# Patient Record
Sex: Female | Born: 1970 | Race: Black or African American | Hispanic: No | Marital: Single | Smoking: Never smoker
Health system: Southern US, Community
[De-identification: ages and names within clinical notes are randomized; demographics above are authoritative.]

## PROBLEM LIST (undated history)

## (undated) DIAGNOSIS — D649 Anemia, unspecified: Secondary | ICD-10-CM

## (undated) DIAGNOSIS — E119 Type 2 diabetes mellitus without complications: Secondary | ICD-10-CM

## (undated) DIAGNOSIS — E538 Deficiency of other specified B group vitamins: Secondary | ICD-10-CM

## (undated) DIAGNOSIS — I10 Essential (primary) hypertension: Secondary | ICD-10-CM

## (undated) DIAGNOSIS — E785 Hyperlipidemia, unspecified: Secondary | ICD-10-CM

## (undated) HISTORY — DX: Anemia, unspecified: D64.9

## (undated) HISTORY — DX: Hyperlipidemia, unspecified: E78.5

## (undated) HISTORY — PX: PARTIAL HYSTERECTOMY: SHX80

## (undated) HISTORY — DX: Type 2 diabetes mellitus without complications: E11.9

## (undated) HISTORY — PX: ABDOMINOPLASTY: SUR9

## (undated) HISTORY — PX: ABDOMINAL HYSTERECTOMY: SHX81

## (undated) HISTORY — DX: Essential (primary) hypertension: I10

## (undated) HISTORY — DX: Deficiency of other specified B group vitamins: E53.8

---

## 1998-01-26 HISTORY — PX: TUBAL LIGATION: SHX77

## 2015-07-01 DIAGNOSIS — E782 Mixed hyperlipidemia: Secondary | ICD-10-CM | POA: Insufficient documentation

## 2015-11-18 DIAGNOSIS — I10 Essential (primary) hypertension: Secondary | ICD-10-CM | POA: Insufficient documentation

## 2016-04-06 DIAGNOSIS — G44229 Chronic tension-type headache, not intractable: Secondary | ICD-10-CM | POA: Insufficient documentation

## 2017-02-09 DIAGNOSIS — D509 Iron deficiency anemia, unspecified: Secondary | ICD-10-CM | POA: Insufficient documentation

## 2017-12-09 DIAGNOSIS — R400 Somnolence: Secondary | ICD-10-CM | POA: Insufficient documentation

## 2018-03-09 DIAGNOSIS — E1165 Type 2 diabetes mellitus with hyperglycemia: Secondary | ICD-10-CM | POA: Insufficient documentation

## 2018-03-09 DIAGNOSIS — E669 Obesity, unspecified: Secondary | ICD-10-CM | POA: Insufficient documentation

## 2019-07-11 DIAGNOSIS — E11319 Type 2 diabetes mellitus with unspecified diabetic retinopathy without macular edema: Secondary | ICD-10-CM | POA: Insufficient documentation

## 2019-09-11 DIAGNOSIS — Z6825 Body mass index (BMI) 25.0-25.9, adult: Secondary | ICD-10-CM | POA: Insufficient documentation

## 2020-03-14 DIAGNOSIS — E538 Deficiency of other specified B group vitamins: Secondary | ICD-10-CM | POA: Insufficient documentation

## 2020-12-09 ENCOUNTER — Encounter: Payer: Self-pay | Admitting: Family Medicine

## 2020-12-09 ENCOUNTER — Ambulatory Visit: Payer: BC Managed Care – PPO | Admitting: Family Medicine

## 2020-12-09 ENCOUNTER — Other Ambulatory Visit: Payer: Self-pay

## 2020-12-09 VITALS — BP 98/72 | HR 89 | Temp 98.2°F | Ht 69.0 in | Wt 191.0 lb

## 2020-12-09 DIAGNOSIS — N898 Other specified noninflammatory disorders of vagina: Secondary | ICD-10-CM | POA: Diagnosis not present

## 2020-12-09 DIAGNOSIS — B9689 Other specified bacterial agents as the cause of diseases classified elsewhere: Secondary | ICD-10-CM | POA: Diagnosis not present

## 2020-12-09 DIAGNOSIS — E118 Type 2 diabetes mellitus with unspecified complications: Secondary | ICD-10-CM | POA: Diagnosis not present

## 2020-12-09 DIAGNOSIS — N760 Acute vaginitis: Secondary | ICD-10-CM | POA: Diagnosis not present

## 2020-12-09 LAB — POCT WET PREP (WET MOUNT)
Trichomonas Wet Prep HPF POC: ABSENT
WBC, Wet Prep HPF POC: ABSENT

## 2020-12-09 LAB — POCT URINALYSIS DIPSTICK
Bilirubin, UA: NEGATIVE
Glucose, UA: POSITIVE — AB
Ketones, UA: NEGATIVE
Leukocytes, UA: NEGATIVE
Nitrite, UA: NEGATIVE
Protein, UA: NEGATIVE
Spec Grav, UA: 1.015 (ref 1.010–1.025)
Urobilinogen, UA: 0.2 E.U./dL
pH, UA: 5 (ref 5.0–8.0)

## 2020-12-09 MED ORDER — METRONIDAZOLE 500 MG PO TABS: 500.0000 mg | ORAL_TABLET | Freq: Two times a day (BID) | ORAL | 0 refills | Status: AC

## 2020-12-09 MED ORDER — JANUVIA 100 MG PO TABS: 100.0000 mg | ORAL_TABLET | Freq: Every day | ORAL | 0 refills | Status: DC

## 2020-12-09 NOTE — Assessment & Plan Note (Signed)
Patient with a few day history of vaginal discharge, thin in nature, mild dysuria denies fever, chills, flank pain.  Abdominal exam reveals soft, nontender, mildly distended and tympanic abdomen with normoactive bowel sounds, no CVA tenderness, suprapubic tenderness absent.  Urinalysis significant for blood and glucose, this will be sent for culture, wet prep performed revealing clue cells.  Plan for metronidazole twice daily x7 days, supportive care, follow-up as needed.

## 2020-12-09 NOTE — Progress Notes (Signed)
Primary Care / Sports Medicine Office Visit  Patient Information:  Patient ID: Kirsten Clark, female DOB: 1970/09/14 Age: 50 y.o. MRN: [id number]   Kirsten Clark is a pleasant 50 y.o. 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   atorvastatin (LIPITOR) 40 MG tablet Take 40 mg by mouth daily.   cyanocobalamin (,VITAMIN B-12,) 1000 MCG/ML injection Inject 1 mL into the muscle every 30 (thirty) days.   glipiZIDE (GLUCOTROL XL) 10 MG 24 hr tablet Take 10 mg by mouth daily.   JARDIANCE 25 MG TABS tablet Take 25 mg by mouth daily.   lisinopril-hydrochlorothiazide (ZESTORETIC) 10-12.5 MG tablet Take 1 tablet by mouth daily.   metFORMIN (GLUCOPHAGE) 1000 MG tablet Take 1,000 mg by mouth 2 (two) times daily.   metroNIDAZOLE (FLAGYL) 500 MG tablet Take 1 tablet (500 mg total) by mouth 2 (two) times daily for 7 days.   valACYclovir (VALTREX) 500 MG tablet Take 500 mg by mouth as needed.   [DISCONTINUED] JANUVIA 100 MG tablet Take 100 mg by mouth daily.   JANUVIA 100 MG tablet Take 1 tablet (100 mg total) by mouth daily.   [DISCONTINUED]  lisinopril (ZESTRIL) 40 MG tablet Take 40 mg by mouth daily.   [DISCONTINUED] terconazole (TERAZOL 7) 0.4 % vaginal cream Place 1 applicator vaginally as needed.   No facility-administered encounter medications on file as of 12/09/2020.   Past Surgical History:  Procedure Laterality Date   ABDOMINAL HYSTERECTOMY     ABDOMINOPLASTY     TUBAL LIGATION  2000    Vitals:   12/09/20 1538  BP: 98/72  Pulse: 89  Temp: 98.2 F (36.8 C)  SpO2: 98%   Vitals:   12/09/20 1538  Weight: 191 lb (86.6 kg)  Height: 5\' 9"  (1.753 m)   Body mass index is 28.21 kg/m.  No results found.   Independent interpretation of notes and tests performed by another provider:   None  Procedures performed:   None  Pertinent History, Exam, Impression, and Recommendations:   Bacterial vaginosis Patient with a few day history of vaginal discharge, thin in nature, mild dysuria denies fever, chills, flank pain.  Abdominal exam reveals soft, nontender, mildly distended and tympanic abdomen with normoactive bowel sounds, no CVA tenderness, suprapubic tenderness absent.  Urinalysis significant for blood and glucose, this will be sent for culture, wet prep performed revealing clue cells.  Plan for metronidazole twice daily x7 days, supportive care, follow-up as needed.  Vaginal discharge See additional assessment(s) for plan details.   Orders & Medications Meds ordered this encounter  Medications   metroNIDAZOLE (FLAGYL) 500 MG tablet  Sig: Take 1 tablet (500 mg total) by mouth 2 (two) times daily for 7 days.    Dispense:  14 tablet    Refill:  0   JANUVIA 100 MG tablet    Sig: Take 1 tablet (100 mg total) by mouth daily.    Dispense:  90 tablet    Refill:  0   Orders Placed This Encounter  Procedures   Urine Culture   POCT Urinalysis Dipstick   POCT Wet Prep [facility])     Return in about 3 weeks (around 12/30/2020) for 3-4 weeks annual physical.     Jerrol Banana, MD   Primary Care  Sports Medicine [facility]

## 2020-12-09 NOTE — Assessment & Plan Note (Signed)
See additional assessment(s) for plan details. 

## 2020-12-09 NOTE — Patient Instructions (Addendum)
-   Start metronidazole, dose twice daily x7 days - Continue your current medications as prescribed, contact us for any refill requests - Maintain follow-up with gynecology as scheduled - Return for follow-up in 3-4 weeks for annual physical, contact us for any questions between now and then

## 2020-12-13 LAB — URINE CULTURE

## 2021-01-06 ENCOUNTER — Other Ambulatory Visit: Payer: Self-pay

## 2021-01-06 DIAGNOSIS — Z1211 Encounter for screening for malignant neoplasm of colon: Secondary | ICD-10-CM

## 2021-01-06 MED ORDER — CLENPIQ 10-3.5-12 MG-GM -GM/160ML PO SOLN
1.0000 | Freq: Once | ORAL | 0 refills | Status: AC
Start: 2021-01-06 — End: 2021-01-06

## 2021-01-06 NOTE — Progress Notes (Signed)
Gastroenterology Pre-Procedure Review  Request Date: 01/23/21 Requesting Physician: Dr. Servando Snare  PATIENT REVIEW QUESTIONS: The patient responded to the following health history questions as indicated:    1. Are you having any GI issues? no 2. Do you have a personal history of Polyps? no 3. Do you have a family history of Colon Cancer or Polyps? no 4. Diabetes Mellitus? yes (Type II) 5. Joint replacements in the past 12 months?no 6. Major health problems in the past 3 months?no 7. Any artificial heart valves, MVP, or defibrillator?no    MEDICATIONS & ALLERGIES:    Patient reports the following regarding taking any anticoagulation/antiplatelet therapy:   Plavix, Coumadin, Eliquis, Xarelto, Lovenox, Pradaxa, Brilinta, or Effient? no Aspirin? no  Patient confirms/reports the following medications:  Current Outpatient Medications  Medication Sig Dispense Refill   acyclovir cream (ZOVIRAX) 5 % Apply 1 application topically as needed.     atorvastatin (LIPITOR) 40 MG tablet Take 40 mg by mouth daily.     cyanocobalamin (,VITAMIN B-12,) 1000 MCG/ML injection Inject 1 mL into the muscle every 30 (thirty) days.     glipiZIDE (GLUCOTROL XL) 10 MG 24 hr tablet Take 10 mg by mouth daily.     JANUVIA 100 MG tablet Take 1 tablet (100 mg total) by mouth daily. 90 tablet 0   JARDIANCE 25 MG TABS tablet Take 25 mg by mouth daily.     lisinopril-hydrochlorothiazide (ZESTORETIC) 10-12.5 MG tablet Take 1 tablet by mouth daily.     metFORMIN (GLUCOPHAGE) 1000 MG tablet Take 1,000 mg by mouth 2 (two) times daily.     valACYclovir (VALTREX) 500 MG tablet Take 500 mg by mouth as needed.     No current facility-administered medications for this visit.    Patient confirms/reports the following allergies:  No Known Allergies  No orders of the defined types were placed in this encounter.   AUTHORIZATION INFORMATION Primary Insurance: 1D#: Group #:  Secondary Insurance: 1D#: Group #:  SCHEDULE  INFORMATION: Date: 01/23/21 Time: Location: [facility]

## 2021-01-07 ENCOUNTER — Ambulatory Visit (INDEPENDENT_AMBULATORY_CARE_PROVIDER_SITE_OTHER): Payer: BC Managed Care – PPO | Admitting: Family Medicine

## 2021-01-07 ENCOUNTER — Encounter: Payer: Self-pay | Admitting: Family Medicine

## 2021-01-07 ENCOUNTER — Other Ambulatory Visit: Payer: Self-pay

## 2021-01-07 VITALS — BP 118/84 | HR 83 | Ht 69.0 in | Wt 196.0 lb

## 2021-01-07 DIAGNOSIS — Z0000 Encounter for general adult medical examination without abnormal findings: Secondary | ICD-10-CM | POA: Insufficient documentation

## 2021-01-07 DIAGNOSIS — I10 Essential (primary) hypertension: Secondary | ICD-10-CM

## 2021-01-07 DIAGNOSIS — E1165 Type 2 diabetes mellitus with hyperglycemia: Secondary | ICD-10-CM

## 2021-01-07 DIAGNOSIS — Z114 Encounter for screening for human immunodeficiency virus [HIV]: Secondary | ICD-10-CM | POA: Diagnosis not present

## 2021-01-07 DIAGNOSIS — E782 Mixed hyperlipidemia: Secondary | ICD-10-CM

## 2021-01-07 DIAGNOSIS — E538 Deficiency of other specified B group vitamins: Secondary | ICD-10-CM

## 2021-01-07 DIAGNOSIS — Z1159 Encounter for screening for other viral diseases: Secondary | ICD-10-CM

## 2021-01-07 DIAGNOSIS — D509 Iron deficiency anemia, unspecified: Secondary | ICD-10-CM

## 2021-01-07 DIAGNOSIS — E559 Vitamin D deficiency, unspecified: Secondary | ICD-10-CM | POA: Diagnosis not present

## 2021-01-07 DIAGNOSIS — E118 Type 2 diabetes mellitus with unspecified complications: Secondary | ICD-10-CM | POA: Insufficient documentation

## 2021-01-07 DIAGNOSIS — Z13220 Encounter for screening for lipoid disorders: Secondary | ICD-10-CM

## 2021-01-07 LAB — POCT URINALYSIS DIPSTICK
Bilirubin, UA: NEGATIVE
Blood, UA: NEGATIVE
Glucose, UA: POSITIVE — AB
Ketones, UA: NEGATIVE
Leukocytes, UA: NEGATIVE
Nitrite, UA: 0.2
Protein, UA: NEGATIVE
Spec Grav, UA: 1.01 (ref 1.010–1.025)
Urobilinogen, UA: NEGATIVE E.U./dL — AB
pH, UA: 7 (ref 5.0–8.0)

## 2021-01-07 NOTE — Patient Instructions (Signed)
-   Obtain fasting labs with orders provided (can have water or black coffee but otherwise no food or drink x 8 hours before labs) - Review information provided - Attend eye doctor annually, dentist every 6 months, work towards or maintain 30 minutes of moderate intensity physical activity at least 5 days per week, and consume a balanced diet - Return in 3 months - Contact us for any questions between now and then 

## 2021-01-07 NOTE — Assessment & Plan Note (Signed)
Risk stratification labs ordered, compliant with medications, referral to nutrition offered, patient declined; follow-up in 3 months.

## 2021-01-07 NOTE — Assessment & Plan Note (Signed)
Noted on history, repeat iron panel ordered in addition to CBC.  We will follow results and treat accordingly.

## 2021-01-07 NOTE — Assessment & Plan Note (Signed)
See additional assessment(s) for plan details. Medical record release for optometry records sought.

## 2021-01-07 NOTE — Assessment & Plan Note (Signed)
Annual examination completed, risk stratification labs ordered, anticipatory guidance provided.  We will follow labs once resulted. 

## 2021-01-07 NOTE — Assessment & Plan Note (Signed)
Risk stratification labs ordered 

## 2021-01-07 NOTE — Assessment & Plan Note (Signed)
Noted on history, recheck of B12, folate, CBC ordered

## 2021-01-07 NOTE — Assessment & Plan Note (Signed)
Stable, controlled.  We will follow-up in 3 months.

## 2021-01-07 NOTE — Progress Notes (Signed)
Annual Physical Exam Visit  Patient Information:  Patient ID: Kirsten Clark, female DOB: 1970-06-22 Age: 50 y.o. MRN: [id number]   Subjective:   CC: Annual Physical Exam  HPI:  Kirsten Clark is here for their annual physical.  I reviewed the past medical history, family history, social history, surgical history, and allergies today and changes were made as necessary.  Please see the problem list section below for additional details.  Past Medical History: Past Medical History:  Diagnosis Date   Anemia    Diabetes mellitus without complication (HCC)    Hyperlipidemia    Hypertension    Vitamin B12 deficiency    Past Surgical History: Past Surgical History:  Procedure Laterality Date   ABDOMINAL HYSTERECTOMY     ABDOMINOPLASTY     TUBAL LIGATION  2000   Family History: Family History  Problem Relation Age of Onset   Diabetes Mother    Diabetes Paternal Grandfather    Allergies: No Known Allergies Health Maintenance: Health Maintenance  Topic Date Due   HEMOGLOBIN A1C  Never done   Pneumococcal Vaccine 34-24 Years old (1 - PCV) Never done   Hepatitis C Screening  Never done   Zoster Vaccines- Shingrix (1 of 2) Never done   COLONOSCOPY (Pts 45-63yrs Insurance coverage will need to be confirmed)  01/24/2021 (Originally 10/15/2015)   COVID-19 Vaccine (3 - Booster for Moderna series) 02/25/2021 (Originally 08/10/2019)   INFLUENZA VACCINE  04/25/2021 (Originally 08/26/2020)   OPHTHALMOLOGY EXAM  11/26/2021   FOOT EXAM  01/07/2022   MAMMOGRAM  12/24/2022   PAP SMEAR-Modifier  12/24/2023   TETANUS/TDAP  01/27/2028   HIV Screening  Completed   HPV VACCINES  Aged Out    HM Colonoscopy           Postponed - COLONOSCOPY (Pts 45-72yrs Insurance coverage will need to be confirmed) (Every 10 Years) Postponed until 01/24/2021    No completion history exists for this topic.           Medications: Current Outpatient Medications on File Prior to Visit  Medication  Sig Dispense Refill   acyclovir cream (ZOVIRAX) 5 % Apply 1 application topically every 6 (six) hours as needed.     atorvastatin (LIPITOR) 40 MG tablet Take 40 mg by mouth daily.     glipiZIDE (GLUCOTROL XL) 10 MG 24 hr tablet Take 10 mg by mouth daily.     JANUVIA 100 MG tablet Take 1 tablet (100 mg total) by mouth daily. 90 tablet 0   JARDIANCE 25 MG TABS tablet Take 25 mg by mouth daily.     lisinopril-hydrochlorothiazide (ZESTORETIC) 10-12.5 MG tablet Take 1 tablet by mouth daily.     metFORMIN (GLUCOPHAGE) 1000 MG tablet Take 1,000 mg by mouth 2 (two) times daily.     valACYclovir (VALTREX) 500 MG tablet Take 500 mg by mouth as needed.     CLENPIQ 10-3.5-12 MG-GM -GM/160ML SOLN Take by mouth. (Patient not taking: Reported on 01/07/2021)     cyanocobalamin (,VITAMIN B-12,) 1000 MCG/ML injection Inject 1 mL into the muscle every 30 (thirty) days. (Patient not taking: Reported on 01/07/2021)     No current facility-administered medications on file prior to visit.    Review of Systems: No headache, visual changes, nausea, vomiting, diarrhea, constipation, dizziness, abdominal pain, skin rash, fevers, chills, night sweats, swollen lymph nodes, weight loss, chest pain, body aches, joint swelling, muscle aches, shortness of breath, mood changes, visual or auditory hallucinations reported.  Objective:  Vitals:   01/07/21 1341  BP: 118/84  Pulse: 83  SpO2: 96%   Vitals:   01/07/21 1341  Weight: 196 lb (88.9 kg)  Height: 5\' 9"  (1.753 m)   Body mass index is 28.94 kg/m.  General: Well Developed, well nourished, and in no acute distress.  Neuro: Alert and oriented x3, extra-ocular muscles intact, sensation grossly intact. Cranial nerves II through XII are grossly intact, motor, sensory, and coordinative functions are intact. HEENT: Normocephalic, atraumatic, pupils equal round reactive to light, neck supple, no masses, no lymphadenopathy, thyroid nonpalpable. Oropharynx, nasopharynx,  external ear canals are unremarkable. Skin: Warm and dry, no rashes noted.  Cardiac: Regular rate and rhythm, no murmurs rubs or gallops. No peripheral edema. Pulses symmetric. Respiratory: Clear to auscultation bilaterally. Not using accessory muscles, speaking in full sentences.  Abdominal: Soft, nontender, nondistended, positive bowel sounds, no masses, no organomegaly. Musculoskeletal: Shoulder, elbow, wrist, hip, knee, ankle stable, and with full range of motion.  Female chaperone initials: BN present throughout the physical examination.  Impression and Recommendations:   The patient was counselled, risk factors were discussed, and anticipatory guidance given.  Essential hypertension Stable, controlled.  We will follow-up in 3 months.  Hyperglycemia due to type 2 diabetes mellitus (HCC) Risk stratification labs ordered, compliant with medications, referral to nutrition offered, patient declined; follow-up in 3 months.  Mixed hyperlipidemia Risk stratification labs ordered  Iron deficiency anemia Noted on history, repeat iron panel ordered in addition to CBC.  We will follow results and treat accordingly.  Cobalamin deficiency Noted on history, recheck of B12, folate, CBC ordered  Annual physical exam Annual examination completed, risk stratification labs ordered, anticipatory guidance provided.  We will follow labs once resulted.   Orders & Medications Medications: No orders of the defined types were placed in this encounter.  Orders Placed This Encounter  Procedures   TSH Rfx on Abnormal to Free T4   Lipid panel   Apo A1 + B + Ratio   B12 and Folate Panel   Iron, TIBC and Ferritin Panel   CBC with Differential/Platelet   Comprehensive metabolic panel   Hemoglobin A1c   VITAMIN D 25 Hydroxy (Vit-D Deficiency, Fractures)   HIV Antibody (routine testing w rflx)   Hepatitis C antibody   POCT urinalysis dipstick     Return in about 3 months (around 04/07/2021) for  diabetes follow-up.    04/09/2021, MD   Primary Care Sports Medicine [facility]

## 2021-01-11 LAB — COMPREHENSIVE METABOLIC PANEL
ALT: 52 IU/L — ABNORMAL HIGH (ref 0–32)
AST: 22 IU/L (ref 0–40)
Albumin/Globulin Ratio: 1.6 (ref 1.2–2.2)
Albumin: 4.4 g/dL (ref 3.8–4.8)
Alkaline Phosphatase: 105 IU/L (ref 44–121)
BUN/Creatinine Ratio: 17 (ref 9–23)
BUN: 15 mg/dL (ref 6–24)
Bilirubin Total: 0.3 mg/dL (ref 0.0–1.2)
CO2: 25 mmol/L (ref 20–29)
Calcium: 9.9 mg/dL (ref 8.7–10.2)
Chloride: 101 mmol/L (ref 96–106)
Creatinine, Ser: 0.89 mg/dL (ref 0.57–1.00)
Globulin, Total: 2.8 g/dL (ref 1.5–4.5)
Glucose: 156 mg/dL — ABNORMAL HIGH (ref 70–99)
Potassium: 5.6 mmol/L — ABNORMAL HIGH (ref 3.5–5.2)
Sodium: 140 mmol/L (ref 134–144)
Total Protein: 7.2 g/dL (ref 6.0–8.5)
eGFR: 79 mL/min/{1.73_m2} (ref >59)

## 2021-01-11 LAB — LIPID PANEL
Chol/HDL Ratio: 4 ratio (ref 0.0–4.4)
Cholesterol, Total: 155 mg/dL (ref 100–199)
HDL: 39 mg/dL — ABNORMAL LOW (ref >39)
LDL Chol Calc (NIH): 98 mg/dL (ref 0–99)
Triglycerides: 97 mg/dL (ref 0–149)
VLDL Cholesterol Cal: 18 mg/dL (ref 5–40)

## 2021-01-11 LAB — HEPATITIS C ANTIBODY: Hep C Virus Ab: 0.1 s/co ratio (ref 0.0–0.9)

## 2021-01-11 LAB — IRON,TIBC AND FERRITIN PANEL
Ferritin: 125 ng/mL (ref 15–150)
Iron Saturation: 21 % (ref 15–55)
Iron: 65 ug/dL (ref 27–159)
Total Iron Binding Capacity: 309 ug/dL (ref 250–450)
UIBC: 244 ug/dL (ref 131–425)

## 2021-01-11 LAB — CBC WITH DIFFERENTIAL/PLATELET
Basophils Absolute: 0.1 10*3/uL (ref 0.0–0.2)
Basos: 1 %
EOS (ABSOLUTE): 0.2 10*3/uL (ref 0.0–0.4)
Eos: 3 %
Hematocrit: 44.8 % (ref 34.0–46.6)
Hemoglobin: 14.2 g/dL (ref 11.1–15.9)
Immature Grans (Abs): 0 10*3/uL (ref 0.0–0.1)
Immature Granulocytes: 0 %
Lymphocytes Absolute: 2.6 10*3/uL (ref 0.7–3.1)
Lymphs: 33 %
MCH: 23.6 pg — ABNORMAL LOW (ref 26.6–33.0)
MCHC: 31.7 g/dL (ref 31.5–35.7)
MCV: 74 fL — ABNORMAL LOW (ref 79–97)
Monocytes Absolute: 0.5 10*3/uL (ref 0.1–0.9)
Monocytes: 6 %
Neutrophils Absolute: 4.5 10*3/uL (ref 1.4–7.0)
Neutrophils: 57 %
Platelets: 344 10*3/uL (ref 150–450)
RBC: 6.02 x10E6/uL — ABNORMAL HIGH (ref 3.77–5.28)
RDW: 14.5 % (ref 11.7–15.4)
WBC: 7.9 10*3/uL (ref 3.4–10.8)

## 2021-01-11 LAB — APO A1 + B + RATIO
Apolipo. B/A-1 Ratio: 0.8 ratio — ABNORMAL HIGH (ref 0.0–0.6)
Apolipoprotein A-1: 125 mg/dL (ref 116–209)
Apolipoprotein B: 95 mg/dL — ABNORMAL HIGH (ref <90)

## 2021-01-11 LAB — B12 AND FOLATE PANEL
Folate: 16.1 ng/mL (ref >3.0)
Vitamin B-12: 269 pg/mL (ref 232–1245)

## 2021-01-11 LAB — HEMOGLOBIN A1C
Est. average glucose Bld gHb Est-mCnc: 186 mg/dL
Hgb A1c MFr Bld: 8.1 % — ABNORMAL HIGH (ref 4.8–5.6)

## 2021-01-11 LAB — TSH RFX ON ABNORMAL TO FREE T4: TSH: 0.822 u[IU]/mL (ref 0.450–4.500)

## 2021-01-11 LAB — HIV ANTIBODY (ROUTINE TESTING W REFLEX): HIV Screen 4th Generation wRfx: NONREACTIVE

## 2021-01-11 LAB — VITAMIN D 25 HYDROXY (VIT D DEFICIENCY, FRACTURES): Vit D, 25-Hydroxy: 22.3 ng/mL — ABNORMAL LOW (ref 30.0–100.0)

## 2021-01-13 ENCOUNTER — Other Ambulatory Visit: Payer: Self-pay | Admitting: Family Medicine

## 2021-01-13 ENCOUNTER — Telehealth: Payer: Self-pay

## 2021-01-13 DIAGNOSIS — E559 Vitamin D deficiency, unspecified: Secondary | ICD-10-CM

## 2021-01-13 MED ORDER — VITAMIN D (ERGOCALCIFEROL) 1.25 MG (50000 UNIT) PO CAPS: 50000.0000 [IU] | ORAL_CAPSULE | ORAL | 0 refills | Status: DC

## 2021-01-13 NOTE — Telephone Encounter (Signed)
Patient notified and verbalized understanding.  Prescription vitamin D sent to the pharmacy electronically and patient will start over the counter vitamin B12 or B-complex. No further questions at this time.

## 2021-01-13 NOTE — Telephone Encounter (Signed)
-----   Message from Jerrol Banana, MD sent at 01/13/2021  8:22 AM EST ----- Please let patient know that the labs came back with main results showing that she needs to start an over the counter daily B12 (or B complex) vitamin, additionally her vitamin D was low enough that she needs a Rx course, this has been sent to her pharmacy on file.  Please advise her to continue to focus on healthier dietary and exercise lifestyle changes.

## 2021-01-22 ENCOUNTER — Telehealth: Payer: Self-pay

## 2021-01-22 NOTE — Telephone Encounter (Signed)
Patient left message regarding canceling  procedure for tomorrow. Procedure has been cancelled. Called pre-service to verify insurance cost. They explained what the letter was about. Informed patient of this information. Pt plans to contact [employer].

## 2021-01-23 ENCOUNTER — Ambulatory Visit
Admission: RE | Admit: 2021-01-23 | Payer: BC Managed Care – PPO | Source: Home / Self Care | Admitting: Gastroenterology

## 2021-01-23 ENCOUNTER — Encounter: Admission: RE | Payer: Self-pay | Source: Home / Self Care

## 2021-01-23 SURGERY — COLONOSCOPY WITH PROPOFOL
Anesthesia: General

## 2021-02-20 ENCOUNTER — Telehealth: Payer: Self-pay | Admitting: Family Medicine

## 2021-02-20 NOTE — Telephone Encounter (Unsigned)
Copied from CRM [id number]. Topic: General - Other >> Feb 20, 2021  8:13 AM Jaquita Rector A wrote: Reason for CRM: Patient called in asking if Dr Ashley Royalty nurse can please give her a call to discuss some FMLA paper work that she discussed with Dr Ashley Royalty on her last visit. Please call patient at Ph# [phone]

## 2021-02-20 NOTE — Telephone Encounter (Signed)
Spoke with patient to get more information about why she is requesting FMLA paperwork.  No indication in her chart.  Per patient wants to get FMLA due to "waking up with high blood pressure and low blood sugar sometimes" and not "feeling like I want to go to work those days." Explained to patient her last HA1C level was 8.1, which is high and indicates consistent high blood sugars and we have her on blood pressure medications to regulate her readings.  Patient is only on oral diabetic medications at this time and no indication she is checking blood sugars. Patient states she does not understand why we cannot fill out FMLA for her and wants to find a new PCP.  Cancelled upcoming appointment on 04/07/21.  Please advise if applicable.  For your information.

## 2021-03-19 ENCOUNTER — Telehealth: Payer: Self-pay

## 2021-03-19 NOTE — Telephone Encounter (Signed)
Patient wants to be called back later

## 2021-03-20 ENCOUNTER — Telehealth: Payer: Self-pay

## 2021-03-20 NOTE — Telephone Encounter (Signed)
Incoming call  Patient states she missed a call on 03/20/2021 and is returning the call. Would like a call back.

## 2021-03-20 NOTE — Telephone Encounter (Signed)
CALLED PATIENT NO ANSWER LEFT VOICEMAIL FOR A CALL BACK ? ?

## 2021-04-07 ENCOUNTER — Ambulatory Visit: Payer: BC Managed Care – PPO | Admitting: Family Medicine

## 2021-04-17 ENCOUNTER — Encounter: Payer: Self-pay | Admitting: Family

## 2021-04-17 ENCOUNTER — Ambulatory Visit: Payer: BC Managed Care – PPO | Admitting: Family

## 2021-04-17 ENCOUNTER — Other Ambulatory Visit: Payer: Self-pay

## 2021-04-17 VITALS — BP 118/60 | HR 87 | Ht 68.0 in | Wt 189.0 lb

## 2021-04-17 DIAGNOSIS — G44229 Chronic tension-type headache, not intractable: Secondary | ICD-10-CM | POA: Diagnosis not present

## 2021-04-17 DIAGNOSIS — E118 Type 2 diabetes mellitus with unspecified complications: Secondary | ICD-10-CM | POA: Diagnosis not present

## 2021-04-17 DIAGNOSIS — E049 Nontoxic goiter, unspecified: Secondary | ICD-10-CM

## 2021-04-17 DIAGNOSIS — D519 Vitamin B12 deficiency anemia, unspecified: Secondary | ICD-10-CM | POA: Diagnosis not present

## 2021-04-17 DIAGNOSIS — E559 Vitamin D deficiency, unspecified: Secondary | ICD-10-CM | POA: Insufficient documentation

## 2021-04-17 DIAGNOSIS — R5383 Other fatigue: Secondary | ICD-10-CM | POA: Diagnosis not present

## 2021-04-17 DIAGNOSIS — E11319 Type 2 diabetes mellitus with unspecified diabetic retinopathy without macular edema: Secondary | ICD-10-CM

## 2021-04-17 DIAGNOSIS — E538 Deficiency of other specified B group vitamins: Secondary | ICD-10-CM

## 2021-04-17 DIAGNOSIS — I10 Essential (primary) hypertension: Secondary | ICD-10-CM

## 2021-04-17 DIAGNOSIS — E782 Mixed hyperlipidemia: Secondary | ICD-10-CM

## 2021-04-17 DIAGNOSIS — Z6828 Body mass index (BMI) 28.0-28.9, adult: Secondary | ICD-10-CM

## 2021-04-17 LAB — COMPREHENSIVE METABOLIC PANEL
ALT: 26 U/L (ref 0–35)
AST: 18 U/L (ref 0–37)
Albumin: 4.4 g/dL (ref 3.5–5.2)
Alkaline Phosphatase: 71 U/L (ref 39–117)
BUN: 12 mg/dL (ref 6–23)
CO2: 32 mEq/L (ref 19–32)
Calcium: 9.7 mg/dL (ref 8.4–10.5)
Chloride: 101 mEq/L (ref 96–112)
Creatinine, Ser: 0.82 mg/dL (ref 0.40–1.20)
GFR: 83.35 mL/min (ref >60.00)
Glucose, Bld: 126 mg/dL — ABNORMAL HIGH (ref 70–99)
Potassium: 4.3 mEq/L (ref 3.5–5.1)
Sodium: 139 mEq/L (ref 135–145)
Total Bilirubin: 0.4 mg/dL (ref 0.2–1.2)
Total Protein: 7.1 g/dL (ref 6.0–8.3)

## 2021-04-17 LAB — CBC WITH DIFFERENTIAL/PLATELET
Basophils Absolute: 0.1 10*3/uL (ref 0.0–0.1)
Basophils Relative: 0.6 % (ref 0.0–3.0)
Eosinophils Absolute: 0.3 10*3/uL (ref 0.0–0.7)
Eosinophils Relative: 3.3 % (ref 0.0–5.0)
HCT: 40.1 % (ref 36.0–46.0)
Hemoglobin: 12.4 g/dL (ref 12.0–15.0)
Lymphocytes Relative: 24.7 % (ref 12.0–46.0)
Lymphs Abs: 2 10*3/uL (ref 0.7–4.0)
MCHC: 31 g/dL (ref 30.0–36.0)
MCV: 75.9 fl — ABNORMAL LOW (ref 78.0–100.0)
Monocytes Absolute: 0.6 10*3/uL (ref 0.1–1.0)
Monocytes Relative: 7 % (ref 3.0–12.0)
Neutro Abs: 5.3 10*3/uL (ref 1.4–7.7)
Neutrophils Relative %: 64.4 % (ref 43.0–77.0)
Platelets: 288 10*3/uL (ref 150.0–400.0)
RBC: 5.28 Mil/uL — ABNORMAL HIGH (ref 3.87–5.11)
RDW: 15.9 % — ABNORMAL HIGH (ref 11.5–15.5)
WBC: 8.2 10*3/uL (ref 4.0–10.5)

## 2021-04-17 LAB — LIPID PANEL
Cholesterol: 133 mg/dL (ref 0–200)
HDL: 39.1 mg/dL (ref >39.00)
LDL Cholesterol: 80 mg/dL (ref 0–99)
NonHDL: 94.29
Total CHOL/HDL Ratio: 3
Triglycerides: 70 mg/dL (ref 0.0–149.0)
VLDL: 14 mg/dL (ref 0.0–40.0)

## 2021-04-17 LAB — MICROALBUMIN / CREATININE URINE RATIO
Creatinine,U: 40.1 mg/dL
Microalb Creat Ratio: 1.7 mg/g (ref 0.0–30.0)
Microalb, Ur: <0.7 mg/dL (ref 0.0–1.9)

## 2021-04-17 LAB — POCT GLYCOSYLATED HEMOGLOBIN (HGB A1C): Hemoglobin A1C: 7.2 % — AB (ref 4.0–5.6)

## 2021-04-17 LAB — B12 AND FOLATE PANEL
Folate: >24.2 ng/mL (ref >5.9)
Vitamin B-12: 1067 pg/mL — ABNORMAL HIGH (ref 211–911)

## 2021-04-17 LAB — T4, FREE: Free T4: 1.01 ng/dL (ref 0.60–1.60)

## 2021-04-17 LAB — VITAMIN D 25 HYDROXY (VIT D DEFICIENCY, FRACTURES): VITD: 38.24 ng/mL (ref 30.00–100.00)

## 2021-04-17 LAB — TSH: TSH: 0.7 u[IU]/mL (ref 0.35–5.50)

## 2021-04-17 LAB — T3, FREE: T3, Free: 4.4 pg/mL — ABNORMAL HIGH (ref 2.3–4.2)

## 2021-04-17 MED ORDER — ATORVASTATIN CALCIUM 40 MG PO TABS: 40.0000 mg | ORAL_TABLET | Freq: Every day | ORAL | 1 refills | Status: DC

## 2021-04-17 MED ORDER — JANUMET 50-1000 MG PO TABS: 1.0000 | ORAL_TABLET | Freq: Two times a day (BID) | ORAL | 1 refills | Status: DC

## 2021-04-17 MED ORDER — JARDIANCE 25 MG PO TABS: 25.0000 mg | ORAL_TABLET | Freq: Every day | ORAL | 1 refills | Status: AC

## 2021-04-17 MED ORDER — BUTALBITAL-APAP-CAFFEINE 50-325-40 MG PO TABS: 1.0000 | ORAL_TABLET | Freq: Four times a day (QID) | ORAL | 0 refills | Status: DC | PRN

## 2021-04-17 MED ORDER — GLUCOSE BLOOD VI STRP: ORAL_STRIP | 12 refills | Status: AC

## 2021-04-17 MED ORDER — GLUCOSE BLOOD VI STRP: ORAL_STRIP | 12 refills | Status: DC

## 2021-04-17 MED ORDER — GLIPIZIDE ER 10 MG PO TB24: 10.0000 mg | ORAL_TABLET | Freq: Every day | ORAL | 1 refills | Status: DC

## 2021-04-17 NOTE — Patient Instructions (Addendum)
Stop by the lab prior to leaving today. I will notify you of your results once received.  ? ?You should be taking 2000 IU once daily of vitamin D. ? ?You should be taking 1000 mcg B12.  ? ?Stop by the lab prior to leaving today. I will notify you of your results once received.  ? ?Make follow up appointment for diabetes.  ? ?I have ordered imaging for you at [facility] outpatient diagnostic center and thyroid ultrasound. This order has been sent over for you electronically.  ?Please call [phone] to schedule this appointment if you do not hear from them to schedule in the next two weeks. ? ?It was a pleasure seeing you today! Please do not hesitate to reach out with any questions and or concerns. ? ?Regards,  ? ?Henry Utsey ?FNP-C ? ? ? ?  ?

## 2021-04-17 NOTE — Progress Notes (Signed)
? ?New Patient Office Visit ? ?Subjective:  ?Patient ID: Kirsten Clark, female    DOB: 28-Dec-1970  Age: 51 y.o. MRN: [id number] ? ?CC:  ?Chief Complaint  ?Patient presents with  ? New Patient (Initial Visit)  ?  Pt stated--need to fill out for FMLA for diabetes.  ? ? ?HPI ?Kirsten Clark is here to establish care as a new patient. 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  ? ?Prior provider was: Dr. Rosette Clark  ?Pt is with with acute concerns ? ?Last lab work with high potassium: no heart arrhythmia. No chest pain.  ? ?Goiter on exam: often feels fatigue, was having trouble with weight gain but seeming to stabilize. No dry skin, does have thinning hair often.  ? ?chronic concerns: ? ?DM2:  ?Lab Results  ?Component Value Date  ? HGBA1C 7.2 (A) 04/17/2021  ?Has a working meter at home she is checking her sugars only as needed not always.  ?Does have urinary frequency. Last eye exam did have some retinopathy.  ?03/29/21, 208 ?03/17/21, 67 ?02/24/21, 55 ?02/24/31, 74 ?02/16/21, 65 ?02/04/21, 110 ? ?Chronic tension headaches: started about 5 years ago, will feel neck pain tenderess preceding and then goes to frontal area. Will get headache behind the eyes. Denies aura. Nauseous at times. Not light sensitive. But does have to lie and go to sleep to make it go away. Doesn't take any medication. These occur about once monthly . Occur for about 1-2 hours. When the headaches occur she is very tired and they make her feel faint and she needs to sleep.  ? ? Last eye exam was a while ago.  ?  ? ?Past Medical History:  ?Diagnosis Date  ? Anemia   ? Diabetes mellitus without complication ([place])   ? Hyperlipidemia   ? Hypertension   ? Vitamin B12 deficiency   ? ? ?Past Surgical History:  ?Procedure Laterality Date  ? ABDOMINAL HYSTERECTOMY    ? ABDOMINOPLASTY    ? TUBAL LIGATION  2000  ? ? ?Family History  ?Problem Relation Age of Onset  ? Diabetes Mother   ? Diabetes Paternal Grandfather   ? ? ?Social History  ? ?Socioeconomic History  ?  Marital status: Single  ?  Spouse name: Not on file  ? Number of children: 3  ? Years of education: 27  ? Highest education level: High school graduate  ?Occupational History  ? Occupation: [employer]  ?Tobacco Use  ? Smoking status: Never  ? Smokeless tobacco: Never  ?Vaping Use  ? Vaping Use: Never used  ?Substance and Sexual Activity  ? Alcohol use: Never  ? Drug use: Never  ? Sexual activity: Not Currently  ?  Partners: Male  ?Other Topics Concern  ? Not on file  ?Social History Narrative  ? Not on file  ? ?Social Determinants of Health  ? ?Financial Resource Strain: Not on file  ?Food Insecurity: Not on file  ?Transportation Needs: Not on file  ?Physical Activity: Not on file  ?Stress: Not on file  ?Social Connections: Not on file  ?Intimate Partner Violence: Not on file  ? ? ?Outpatient Medications Prior to Visit  ?Medication Sig Dispense Refill  ? acyclovir cream (ZOVIRAX) 5 % Apply 1 application topically every 6 (six) hours as needed.    ? lisinopril-hydrochlorothiazide (ZESTORETIC) 10-12.5 MG tablet Take 1 tablet by mouth daily.    ? valACYclovir (VALTREX) 500 MG tablet Take 500 mg by mouth as needed.    ? Vitamin D,  Ergocalciferol, (DRISDOL) 1.25 MG (50000 UNIT) CAPS capsule Take 1 capsule (50,000 Units total) by mouth every 7 (seven) days. 8 capsule 0  ? atorvastatin (LIPITOR) 40 MG tablet Take 40 mg by mouth daily.    ? CLENPIQ 10-3.5-12 MG-GM -GM/160ML SOLN Take by mouth. (Patient not taking: Reported on 01/07/2021)    ? cyanocobalamin (,VITAMIN B-12,) 1000 MCG/ML injection Inject 1 mL into the muscle every 30 (thirty) days. (Patient not taking: Reported on 01/07/2021)    ? glipiZIDE (GLUCOTROL XL) 10 MG 24 hr tablet Take 10 mg by mouth daily.    ? JANUVIA 100 MG tablet Take 1 tablet (100 mg total) by mouth daily. 90 tablet 0  ? JARDIANCE 25 MG TABS tablet Take 25 mg by mouth daily.    ? metFORMIN (GLUCOPHAGE) 1000 MG tablet Take 1,000 mg by mouth 2 (two) times daily.    ? ?No  facility-administered medications prior to visit.  ? ? ?No Known Allergies ? ?ROS ?Review of Systems  ?Constitutional:  Positive for fatigue and unexpected weight change (trouble losing weight). Negative for chills and fever.  ?Eyes:  Negative for visual disturbance.  ?Respiratory:  Negative for shortness of breath.   ?Cardiovascular:  Negative for chest pain, palpitations and leg swelling.  ?Gastrointestinal:  Negative for abdominal pain.  ?Genitourinary:  Positive for frequency. Negative for difficulty urinating, dysuria, menstrual problem and urgency.  ?Skin:  Negative for rash.  ?     Thinning of hair ?  ?Neurological:  Positive for light-headedness (at times) and headaches (at times with neck pain preceding). Negative for dizziness.  ? ? ? ?  ?Objective:  ?  ?Physical Exam ?Vitals reviewed.  ?Constitutional:   ?   General: She is not in acute distress. ?   Appearance: Normal appearance. She is not ill-appearing or toxic-appearing.  ?HENT:  ?   Right Ear: Tympanic membrane normal.  ?   Left Ear: Tympanic membrane normal.  ?   Mouth/Throat:  ?   Mouth: Mucous membranes are moist.  ?   Pharynx: No pharyngeal swelling.  ?   Tonsils: No tonsillar exudate.  ?Eyes:  ?   Extraocular Movements: Extraocular movements intact.  ?   Conjunctiva/sclera: Conjunctivae normal.  ?   Pupils: Pupils are equal, round, and reactive to light.  ?Neck:  ?   Thyroid: Thyromegaly (right > left) present. No thyroid mass.  ?Cardiovascular:  ?   Rate and Rhythm: Normal rate and regular rhythm.  ?Pulmonary:  ?   Effort: Pulmonary effort is normal.  ?   Breath sounds: Normal breath sounds.  ?Abdominal:  ?   General: Abdomen is flat. Bowel sounds are normal.  ?   Palpations: Abdomen is soft.  ?Musculoskeletal:     ?   General: Normal range of motion.  ?Lymphadenopathy:  ?   Cervical:  ?   Right cervical: No superficial cervical adenopathy. ?   Left cervical: No superficial cervical adenopathy.  ?Skin: ?   General: Skin is warm.  ?   Capillary  Refill: Capillary refill takes less than 2 seconds.  ?Neurological:  ?   General: No focal deficit present.  ?   Mental Status: She is alert and oriented to person, place, and time.  ?Psychiatric:     ?   Mood and Affect: Mood normal.     ?   Behavior: Behavior normal.     ?   Thought Content: Thought content normal.     ?   Judgment: Judgment  normal.  ? ? ? ?BP 118/60   Pulse 87   Ht '5\' 8"'  (1.727 m)   Wt 189 lb (85.7 kg)   SpO2 97%   BMI 28.74 kg/m?  ?Wt Readings from Last 3 Encounters:  ?04/17/21 189 lb (85.7 kg)  ?01/07/21 196 lb (88.9 kg)  ?12/09/20 191 lb (86.6 kg)  ? ? ? ?Health Maintenance Due  ?Topic Date Due  ? COLONOSCOPY (Pts 45-59yr Insurance coverage will need to be confirmed)  Never done  ? COVID-19 Vaccine (3 - Booster for Moderna series) 08/10/2019  ? Zoster Vaccines- Shingrix (1 of 2) Never done  ? ? ?There are no preventive care reminders to display for this patient. ? ?Lab Results  ?Component Value Date  ? TSH 0.70 04/17/2021  ? ?Lab Results  ?Component Value Date  ? WBC 8.2 04/17/2021  ? HGB 12.4 04/17/2021  ? HCT 40.1 04/17/2021  ? MCV 75.9 (L) 04/17/2021  ? PLT 288.0 04/17/2021  ? ?Lab Results  ?Component Value Date  ? NA 139 04/17/2021  ? K 4.3 04/17/2021  ? CO2 32 04/17/2021  ? GLUCOSE 126 (H) 04/17/2021  ? BUN 12 04/17/2021  ? CREATININE 0.82 04/17/2021  ? BILITOT 0.4 04/17/2021  ? ALKPHOS 71 04/17/2021  ? AST 18 04/17/2021  ? ALT 26 04/17/2021  ? PROT 7.1 04/17/2021  ? ALBUMIN 4.4 04/17/2021  ? CALCIUM 9.7 04/17/2021  ? EGFR 79 01/10/2021  ? GFR 83.35 04/17/2021  ? ?Lab Results  ?Component Value Date  ? CHOL 133 04/17/2021  ? ?Lab Results  ?Component Value Date  ? HDL 39.10 04/17/2021  ? ?Lab Results  ?Component Value Date  ? [place] 04/17/2021  ? ?Lab Results  ?Component Value Date  ? TRIG 70.0 04/17/2021  ? ?Lab Results  ?Component Value Date  ? CHOLHDL 3 04/17/2021  ? ?Lab Results  ?Component Value Date  ? HGBA1C 7.2 (A) 04/17/2021  ? ? ?  ?Assessment & Plan:  ? ?Problem List  Items Addressed This Visit   ? ?  ? Cardiovascular and Mediastinum  ? Essential hypertension  ?  Pt advised of the following:  ?Continue medication as prescribed. Monitor blood pressure periodically and/or when you feel symptom

## 2021-04-18 LAB — THYROID PEROXIDASE ANTIBODIES (TPO) (REFL): Thyroperoxidase Ab SerPl-aCnc: <1 IU/mL (ref <9)

## 2021-04-20 DIAGNOSIS — E049 Nontoxic goiter, unspecified: Secondary | ICD-10-CM | POA: Insufficient documentation

## 2021-04-20 DIAGNOSIS — R5383 Other fatigue: Secondary | ICD-10-CM | POA: Insufficient documentation

## 2021-04-20 DIAGNOSIS — Z6828 Body mass index (BMI) 28.0-28.9, adult: Secondary | ICD-10-CM | POA: Insufficient documentation

## 2021-04-20 NOTE — Assessment & Plan Note (Signed)
Ordered hga1c today pending results. Work on diabetic diet and exercise as tolerated. Yearly foot exam, and annual eye exam.  ? ?Refill sent for glipizide 10 mg  ?Refill for jardiance 25 mg ?Sending janumet 50-1000 mg  ? ?Advised pt to d/c metformin and januvia and take janumet instead once picks up RX ? ?Refill for glucose strips and lancets ?Advised pt to check glucose fasting QAM and keep log ?

## 2021-04-20 NOTE — Assessment & Plan Note (Signed)
Trial fioricet sent to pharmacy ?Avoid triggers as able ? ?

## 2021-04-20 NOTE — Assessment & Plan Note (Signed)
vitamin d ordered pending reuslts ?

## 2021-04-20 NOTE — Assessment & Plan Note (Signed)
Pt advised of the following:  ?Continue medication as prescribed. Monitor blood pressure periodically and/or when you feel symptomatic. Goal is <130/90 on average. Ensure that you have rested for 30 minutes prior to checking your blood pressure. Record your readings and bring them to your next visit if necessary.work on a low sodium diet. ? ?Urine microalbumin ordered ?

## 2021-04-20 NOTE — Assessment & Plan Note (Signed)
Refill for atorvastatin 40 mg  ?Ordered lipid panel, pending results. Work on low cholesterol diet and exercise as tolerated ? ?

## 2021-04-20 NOTE — Progress Notes (Signed)
Slight elevation T3, with normal tsh (thyroid hormone) once pt gets [place] of thyroid that was ordered we will determine if necessary to see endo.  ? ?Mild anemia however not significant, will continue to monitor.  ? ?B12 is very high, confirm dose pt is supplementing with. Once we know dose, I recommend we change to every other day or decrease dose.  ? ?Cholesterol and vitamin D good

## 2021-04-20 NOTE — Assessment & Plan Note (Signed)
Pt to make one year f/u appt with opthamology ?

## 2021-04-20 NOTE — Assessment & Plan Note (Signed)
Goiter seen on exam ?US thyroid ordered ?Thyroid panel ordered pending results ?

## 2021-04-20 NOTE — Assessment & Plan Note (Signed)
Ordering cbc pending results ?

## 2021-04-20 NOTE — Assessment & Plan Note (Signed)
b12 ordered pending results 

## 2021-04-20 NOTE — Assessment & Plan Note (Signed)
Total time spent with pt going over history, medications, chronic and acute concerns totaled 52 minutes. This also includes time spent filling out FMLA paperwork ?

## 2021-04-24 ENCOUNTER — Ambulatory Visit: Payer: BC Managed Care – PPO | Admitting: Adult Health

## 2021-04-28 ENCOUNTER — Telehealth: Payer: Self-pay | Admitting: Family

## 2021-04-28 NOTE — Telephone Encounter (Signed)
Pt dropped off paperwork for Medical Leave. Needs Tabitha to look over it and sign. I will leave it in her box. ?

## 2021-05-05 ENCOUNTER — Ambulatory Visit: Payer: BC Managed Care – PPO | Admitting: Adult Health

## 2021-05-12 ENCOUNTER — Telehealth: Payer: Self-pay | Admitting: Family

## 2021-05-12 NOTE — Telephone Encounter (Signed)
Pt walked in stating she is looking for fmla paperwork she left 2-3 weeks ago. Please give her a call ?

## 2021-05-14 ENCOUNTER — Telehealth: Payer: Self-pay | Admitting: Family

## 2021-05-14 ENCOUNTER — Telehealth: Payer: Self-pay

## 2021-05-14 ENCOUNTER — Telehealth: Payer: Self-pay | Admitting: Gastroenterology

## 2021-05-14 NOTE — Telephone Encounter (Signed)
Pt came in asking about some paperwork she looking to pick up  ?

## 2021-05-14 NOTE — Telephone Encounter (Signed)
Spoke to pt is coming back tomorrow to pick up paperwork. ?

## 2021-05-14 NOTE — Telephone Encounter (Signed)
Pt left message to schedule a colonoscopy . ?

## 2021-05-14 NOTE — Telephone Encounter (Signed)
Pt came by to pick up [employer] paperwork states she drop it off the last week of March. I seen where last visit yall talked about it. Pt is coming back tomorrow to pick up paperwork ?

## 2021-05-15 ENCOUNTER — Other Ambulatory Visit: Payer: Self-pay

## 2021-05-15 DIAGNOSIS — Z1211 Encounter for screening for malignant neoplasm of colon: Secondary | ICD-10-CM

## 2021-05-15 NOTE — Telephone Encounter (Signed)
Copies made and sent to scan.

## 2021-05-15 NOTE — Progress Notes (Signed)
Returned call to schedule colonoscopy.  Patient is unable to schedule due to transportation.  She plans to check with a coworker prior to scheduling. ? ?Thanks, ? ?Marcelino Duster, CMA ?

## 2021-05-20 ENCOUNTER — Ambulatory Visit
Admission: RE | Admit: 2021-05-20 | Discharge: 2021-05-20 | Disposition: A | Discharge status: Home / Self Care | Payer: BC Managed Care – PPO | Source: Ambulatory Visit | Attending: Family | Admitting: Family

## 2021-05-20 DIAGNOSIS — R5383 Other fatigue: Secondary | ICD-10-CM | POA: Diagnosis present

## 2021-05-20 DIAGNOSIS — E049 Nontoxic goiter, unspecified: Secondary | ICD-10-CM | POA: Insufficient documentation

## 2021-05-21 ENCOUNTER — Other Ambulatory Visit: Payer: Self-pay | Admitting: Family

## 2021-05-21 DIAGNOSIS — E041 Nontoxic single thyroid nodule: Secondary | ICD-10-CM | POA: Insufficient documentation

## 2021-05-23 ENCOUNTER — Other Ambulatory Visit: Payer: Self-pay | Admitting: Family

## 2021-05-29 NOTE — Progress Notes (Signed)
Patent did not view their my chart test result that I responded to one week ago.  Please call them and advise them of the below results.

## 2021-06-06 LAB — COLOGUARD: COLOGUARD: NEGATIVE

## 2021-07-21 ENCOUNTER — Encounter: Payer: Self-pay | Admitting: Family

## 2021-07-21 ENCOUNTER — Ambulatory Visit: Payer: BC Managed Care – PPO | Admitting: Family

## 2021-07-21 VITALS — BP 128/82 | HR 106 | Temp 98.6°F | Resp 16 | Ht 68.0 in | Wt 191.5 lb

## 2021-07-21 DIAGNOSIS — E118 Type 2 diabetes mellitus with unspecified complications: Secondary | ICD-10-CM

## 2021-07-21 DIAGNOSIS — I10 Essential (primary) hypertension: Secondary | ICD-10-CM | POA: Diagnosis not present

## 2021-07-21 DIAGNOSIS — D519 Vitamin B12 deficiency anemia, unspecified: Secondary | ICD-10-CM

## 2021-07-21 DIAGNOSIS — R946 Abnormal results of thyroid function studies: Secondary | ICD-10-CM | POA: Diagnosis not present

## 2021-07-21 DIAGNOSIS — E041 Nontoxic single thyroid nodule: Secondary | ICD-10-CM

## 2021-07-21 LAB — POCT GLYCOSYLATED HEMOGLOBIN (HGB A1C): Hemoglobin A1C: 8.2 % — AB (ref 4.0–5.6)

## 2021-07-22 LAB — B12 AND FOLATE PANEL
Folate: 24 ng/mL (ref >5.9)
Vitamin B-12: 759 pg/mL (ref 211–911)

## 2021-07-22 LAB — T3, FREE: T3, Free: 3.2 pg/mL (ref 2.3–4.2)

## 2021-07-22 LAB — TSH: TSH: 1.32 u[IU]/mL (ref 0.35–5.50)

## 2021-07-22 NOTE — Assessment & Plan Note (Addendum)
Pending results of a1c, consider maybe stopping dpp4 januvia and starting on glp1 Long talk about diabetic diet, referral to diabetic nutritionist recommend but pt declines. Ordered hga1c today pending results. Work on diabetic diet and exercise as tolerated. Yearly foot exam, and annual eye exam.   Also referring pt to endocrinologist for poorly controlled diabetes as well as suppressed tsh, heterogenous thyroid on [place] and increased fatigue.

## 2021-07-22 NOTE — Assessment & Plan Note (Signed)
Reviewed, repeat tsh. Pending results

## 2021-07-23 ENCOUNTER — Other Ambulatory Visit: Payer: Self-pay | Admitting: Family

## 2021-07-23 ENCOUNTER — Encounter: Payer: Self-pay | Admitting: Family

## 2021-07-23 DIAGNOSIS — E118 Type 2 diabetes mellitus with unspecified complications: Secondary | ICD-10-CM

## 2021-07-23 DIAGNOSIS — Z6828 Body mass index (BMI) 28.0-28.9, adult: Secondary | ICD-10-CM

## 2021-07-23 MED ORDER — METFORMIN HCL 1000 MG PO TABS: 1000.0000 mg | ORAL_TABLET | Freq: Two times a day (BID) | ORAL | 3 refills | Status: DC

## 2021-07-23 MED ORDER — OZEMPIC (0.25 OR 0.5 MG/DOSE) 2 MG/1.5ML ~~LOC~~ SOPN: PEN_INJECTOR | SUBCUTANEOUS | 0 refills | Status: DC

## 2021-07-23 NOTE — Progress Notes (Signed)
Please let pt know I have sent in ozempic.  She will inject 0-.25 mg weekly for one month then increase to 0.5 mg weekly x one month then f/u two months. (As long as ozempic is approved)  If ozempic is approved. She will no longer take janumet (combo pill). She will however start metformin 1000 mg twice daily (this will just be without [place]).   She will continue glipizide as directed.

## 2021-09-01 ENCOUNTER — Telehealth: Payer: Self-pay | Admitting: Family

## 2021-09-01 DIAGNOSIS — Z6828 Body mass index (BMI) 28.0-28.9, adult: Secondary | ICD-10-CM

## 2021-09-01 DIAGNOSIS — E118 Type 2 diabetes mellitus with unspecified complications: Secondary | ICD-10-CM

## 2021-09-01 NOTE — Telephone Encounter (Signed)
Kirsten Clark, please read my note from June 26th.  See if pt has increased to 0.5 mg weekly If she is tolerating medication well I can send in refill for 0.5 mg weekly.   Keep follow up in October.  If not doing well have pt f/u in office.

## 2021-09-01 NOTE — Telephone Encounter (Signed)
Patient came ain and wanted to know will she be prescribed more ofSemaglutide,0.25 or 0.5MG /DOS, (OZEMPIC, 0.25 OR 0.5 MG/DOSE,) 2 MG/1.5ML SOPN  before her appointment that is scheduled in October,or was the prescription that was originally given to her just a trial to see how she doe with it? She would like a phone call please.

## 2021-09-02 NOTE — Telephone Encounter (Signed)
Called pt and she stated that she has finished the 0.5 and did not have any problem with it.

## 2021-09-03 MED ORDER — OZEMPIC (0.25 OR 0.5 MG/DOSE) 2 MG/3ML ~~LOC~~ SOPN: 0.5000 mg | PEN_INJECTOR | SUBCUTANEOUS | 1 refills | Status: DC

## 2021-09-03 NOTE — Addendum Note (Signed)
Addended by: Mort Sawyers on: 09/03/2021 12:13 PM   Modules accepted: Orders

## 2021-10-21 ENCOUNTER — Ambulatory Visit: Payer: BC Managed Care – PPO | Admitting: Family

## 2021-10-27 ENCOUNTER — Ambulatory Visit: Payer: BC Managed Care – PPO | Admitting: Family

## 2021-10-27 ENCOUNTER — Encounter: Payer: Self-pay | Admitting: Family

## 2021-10-27 VITALS — BP 110/74 | HR 96 | Temp 98.2°F | Resp 16 | Ht 68.0 in | Wt 186.0 lb

## 2021-10-27 DIAGNOSIS — J309 Allergic rhinitis, unspecified: Secondary | ICD-10-CM | POA: Insufficient documentation

## 2021-10-27 DIAGNOSIS — Z79899 Other long term (current) drug therapy: Secondary | ICD-10-CM

## 2021-10-27 DIAGNOSIS — I10 Essential (primary) hypertension: Secondary | ICD-10-CM | POA: Diagnosis not present

## 2021-10-27 DIAGNOSIS — E118 Type 2 diabetes mellitus with unspecified complications: Secondary | ICD-10-CM | POA: Diagnosis not present

## 2021-10-27 MED ORDER — LISINOPRIL 10 MG PO TABS: 10.0000 mg | ORAL_TABLET | Freq: Every day | ORAL | 3 refills | Status: DC

## 2021-10-27 NOTE — Assessment & Plan Note (Signed)
Dizziness might be hypotension.  D/w pt to get blood pressure cuff, d/c hctz start lisinopril 10 mg only. Maintain blood pressure log f/u three weeks.

## 2021-10-27 NOTE — Progress Notes (Signed)
Established Patient Office Visit  Subjective:  Patient ID: Kirsten Clark, female    DOB: [birth year]  Age: 51 y.o. MRN: [id number]  CC:  Chief Complaint  Patient presents with   Diabetes    HPI Kirsten Clark is here today for follow up.   Pt is with acute concerns.  DM2: taking glipizide 10 mg once daily as well as metformin 1000 mg twice daily with meals. Also taking ozempic 0.5 mg once weekly. Se is tolerating well.   Wt Readings from Last 3 Encounters:  10/27/21 186 lb (84.4 kg)  07/21/21 191 lb 8 oz (86.9 kg)  04/17/21 189 lb (85.7 kg)   Does often feel dizzy, she does drink water but puts in sugar free sweetener. She is taking liisnopirl hctz 10-12.5 mg once daily.   Past Medical History:  Diagnosis Date   Anemia    Diabetes mellitus without complication ([place])    Hyperlipidemia    Hypertension    Vitamin B12 deficiency     Past Surgical History:  Procedure Laterality Date   ABDOMINAL HYSTERECTOMY     ABDOMINOPLASTY     TUBAL LIGATION  2000    Family History  Problem Relation Age of Onset   Diabetes Mother    Diabetes Paternal Grandfather     Social History   Socioeconomic History   Marital status: Single    Spouse name: Not on file   Number of children: 3   Years of education: 12   Highest education level: High school graduate  Occupational History   Occupation: [employer]  Tobacco Use   Smoking status: Never   Smokeless tobacco: Never  Vaping Use   Vaping Use: Never used  Substance and Sexual Activity   Alcohol use: Never   Drug use: Never   Sexual activity: Not Currently    Partners: Male  Other Topics Concern   Not on file  Social History Narrative   Not on file   Social Determinants of Health   Financial Resource Strain: Not on file  Food Insecurity: Not on file  Transportation Needs: Not on file  Physical Activity: Not on file  Stress: Not on file  Social Connections: Not on file  Intimate Partner Violence: Not on  file    Outpatient Medications Prior to Visit  Medication Sig Dispense Refill   acyclovir cream (ZOVIRAX) 5 % Apply 1 application topically every 6 (six) hours as needed.     butalbital-acetaminophen-caffeine (FIORICET) 50-325-40 MG tablet Take 1 tablet by mouth every 6 (six) hours as needed for headache. 14 tablet 0   glucose blood test strip Check fasting glucose qam 100 each 12   metFORMIN (GLUCOPHAGE) 1000 MG tablet Take 1 tablet (1,000 mg total) by mouth 2 (two) times daily with a meal. 180 tablet 3   Semaglutide,0.25 or 0.5MG/DOS, (OZEMPIC, 0.25 OR 0.5 MG/DOSE,) 2 MG/3ML SOPN Inject 0.5 mg into the skin once a week. 9 mL 1   valACYclovir (VALTREX) 500 MG tablet Take 500 mg by mouth as needed.     lisinopril-hydrochlorothiazide (ZESTORETIC) 10-12.5 MG tablet TAKE ONE TABLET BY MOUTH ONE TIME DAILY 90 tablet 1   atorvastatin (LIPITOR) 40 MG tablet Take 1 tablet (40 mg total) by mouth daily. 90 tablet 1   glipiZIDE (GLUCOTROL XL) 10 MG 24 hr tablet Take 1 tablet (10 mg total) by mouth daily. 90 tablet 1   No facility-administered medications prior to visit.    No Known Allergies  Objective:    Physical Exam Constitutional:      General: She is not in acute distress.    Appearance: Normal appearance. She is obese. She is not ill-appearing.  HENT:     Right Ear: A middle ear effusion is present.     Left Ear: Tympanic membrane normal.     Nose: Nose normal. No congestion or rhinorrhea.     Right Turbinates: Enlarged and swollen.     Left Turbinates: Enlarged and swollen.     Right Sinus: No maxillary sinus tenderness or frontal sinus tenderness.     Left Sinus: No maxillary sinus tenderness or frontal sinus tenderness.     Mouth/Throat:     Mouth: Mucous membranes are moist.     Pharynx: Posterior oropharyngeal erythema (cobblestoning) present. No pharyngeal swelling or oropharyngeal exudate.     Tonsils: No tonsillar exudate.  Eyes:     Extraocular Movements:  Extraocular movements intact.     Conjunctiva/sclera: Conjunctivae normal.     Pupils: Pupils are equal, round, and reactive to light.  Neck:     Thyroid: No thyroid mass.  Cardiovascular:     Rate and Rhythm: Normal rate and regular rhythm.  Pulmonary:     Effort: Pulmonary effort is normal.     Breath sounds: Normal breath sounds.  Lymphadenopathy:     Cervical:     Right cervical: No superficial cervical adenopathy.    Left cervical: No superficial cervical adenopathy.  Neurological:     Mental Status: She is alert.       BP 110/74   Pulse 96   Temp 98.2 F (36.8 C)   Resp 16   Ht _0  (1.727 m)   Wt 186 lb (84.4 kg)   SpO2 95% Comment: Has fake nails  BMI 28.28 kg/m  Wt Readings from Last 3 Encounters:  10/27/21 186 lb (84.4 kg)  07/21/21 191 lb 8 oz (86.9 kg)  04/17/21 189 lb (85.7 kg)     Health Maintenance Due  Topic Date Due   COLONOSCOPY (Pts 45-86yr Insurance coverage will need to be confirmed)  Never done   COVID-19 Vaccine (3 - Moderna series) 08/10/2019   Zoster Vaccines- Shingrix (1 of 2) Never done   INFLUENZA VACCINE  Never done    There are no preventive care reminders to display for this patient.  Lab Results  Component Value Date   TSH 1.32 07/21/2021   Lab Results  Component Value Date   WBC 8.2 04/17/2021   HGB 12.4 04/17/2021   HCT 40.1 04/17/2021   MCV 75.9 (L) 04/17/2021   PLT 288.0 04/17/2021   Lab Results  Component Value Date   NA 139 04/17/2021   K 4.3 04/17/2021   CO2 32 04/17/2021   GLUCOSE 126 (H) 04/17/2021   BUN 12 04/17/2021   CREATININE 0.82 04/17/2021   BILITOT 0.4 04/17/2021   ALKPHOS 71 04/17/2021   AST 18 04/17/2021   ALT 26 04/17/2021   PROT 7.1 04/17/2021   ALBUMIN 4.4 04/17/2021   CALCIUM 9.7 04/17/2021   EGFR 79 01/10/2021   GFR 83.35 04/17/2021   Lab Results  Component Value Date   CHOL 133 04/17/2021   Lab Results  Component Value Date   HDL 39.10 04/17/2021   Lab Results  Component  Value Date   LDLCALC 80 04/17/2021   Lab Results  Component Value Date   TRIG 70.0 04/17/2021   Lab Results  Component Value Date   CHOLHDL 3  04/17/2021   Lab Results  Component Value Date   HGBA1C 8.2 (A) 07/21/2021      Assessment & Plan:   Problem List Items Addressed This Visit       Cardiovascular and Mediastinum   Essential hypertension    Dizziness might be hypotension.  D/w pt to get blood pressure cuff, d/c hctz start lisinopril 10 mg only. Maintain blood pressure log f/u three weeks.        Relevant Medications   lisinopril (ZESTRIL) 10 MG tablet     Respiratory   Allergic rhinitis    Start nightly claritin otc         Endocrine   Diabetes mellitus type 2 with complications ([facility])    Continue glipizide 10 mg bid  Continue ozempic 0.5 mg qweek, consider increase to 1 mg weekly pending hga1c result Continue metformin 1000 mg twice daily   Pt advised to Work on diabetic diet exercise as tolerated      Relevant Medications   lisinopril (ZESTRIL) 10 MG tablet   Other Relevant Orders   Hemoglobin A1c   Other Visit Diagnoses     On statin therapy    -  Primary   Relevant Orders   Comprehensive metabolic panel   Hemoglobin A1c       Meds ordered this encounter  Medications   lisinopril (ZESTRIL) 10 MG tablet    Sig: Take 1 tablet (10 mg total) by mouth daily.    Dispense:  90 tablet    Refill:  3    Order Specific Question:   Supervising Provider    Answer:   Diona Browner, AMY E [[id number]]    Follow-up: Return in about 1 month (around 11/27/2021) for f/u blood pressure.    Eugenia Pancoast, FNP

## 2021-10-27 NOTE — Assessment & Plan Note (Signed)
Continue glipizide 10 mg bid  Continue ozempic 0.5 mg qweek, consider increase to 1 mg weekly pending hga1c result Continue metformin 1000 mg twice daily   Pt advised to Work on diabetic diet exercise as tolerated

## 2021-10-27 NOTE — Patient Instructions (Addendum)
  Stop hctz, continue just only the lisinopril 10 mg once daily.  Check your blood pressure once a day for the next one week.  See if you can a blood pressure cuff.   Recommend starting Claritin over the counter.   Stop by the lab prior to leaving today. I will notify you of your results once received.   Due to recent changes in healthcare laws, you may see results of your imaging and/or laboratory studies on MyChart before I have had a chance to review them.  I understand that in some cases there may be results that are confusing or concerning to you. Please understand that not all results are received at the same time and often I may need to interpret multiple results in order to provide you with the best plan of care or course of treatment. Therefore, I ask that you please give me 2 business days to thoroughly review all your results before contacting my office for clarification. Should we see a critical lab result, you will be contacted sooner.   It was a pleasure seeing you today! Please do not hesitate to reach out with any questions and or concerns.  Regards,   Eugenia Pancoast FNP-C

## 2021-10-27 NOTE — Assessment & Plan Note (Signed)
Start nightly claritin otc

## 2021-10-28 ENCOUNTER — Telehealth: Payer: Self-pay

## 2021-10-28 LAB — COMPREHENSIVE METABOLIC PANEL
ALT: 19 U/L (ref 0–35)
AST: 14 U/L (ref 0–37)
Albumin: 4.3 g/dL (ref 3.5–5.2)
Alkaline Phosphatase: 87 U/L (ref 39–117)
BUN: 18 mg/dL (ref 6–23)
CO2: 27 mEq/L (ref 19–32)
Calcium: 9.7 mg/dL (ref 8.4–10.5)
Chloride: 100 mEq/L (ref 96–112)
Creatinine, Ser: 0.99 mg/dL (ref 0.40–1.20)
GFR: 66.24 mL/min (ref >60.00)
Glucose, Bld: 118 mg/dL — ABNORMAL HIGH (ref 70–99)
Potassium: 4.3 mEq/L (ref 3.5–5.1)
Sodium: 138 mEq/L (ref 135–145)
Total Bilirubin: 0.3 mg/dL (ref 0.2–1.2)
Total Protein: 7.2 g/dL (ref 6.0–8.3)

## 2021-10-28 LAB — HEMOGLOBIN A1C: Hgb A1c MFr Bld: 7.3 % — ABNORMAL HIGH (ref 4.6–6.5)

## 2021-10-28 NOTE — Telephone Encounter (Signed)
Called pt and informed her that form in the front office.Copied and put in scan.

## 2021-10-28 NOTE — Telephone Encounter (Signed)
-----   Message from Eugenia Pancoast, [place] sent at 10/28/2021  7:41 AM EDT ----- Regarding: fmla Please complete address and page 1 bottom of page, and then fax for pt for fmla and or have pt pick up at front office. Keep copy for scans. Fmla in my outbox.

## 2021-10-29 ENCOUNTER — Other Ambulatory Visit: Payer: Self-pay | Admitting: Family

## 2021-10-29 DIAGNOSIS — E118 Type 2 diabetes mellitus with unspecified complications: Secondary | ICD-10-CM

## 2021-10-29 MED ORDER — SEMAGLUTIDE (1 MG/DOSE) 4 MG/3ML ~~LOC~~ SOPN: 1.0000 mg | PEN_INJECTOR | SUBCUTANEOUS | 1 refills | Status: DC

## 2021-10-29 NOTE — Progress Notes (Signed)
A1c has improved from 8.2 to 7.3 however let's increase ozempic dose to 1 mg once weekly, will send in new RX. Repeat in three months with f/u in office.

## 2021-11-11 ENCOUNTER — Encounter: Payer: Self-pay | Admitting: Family

## 2021-11-11 ENCOUNTER — Other Ambulatory Visit: Payer: Self-pay | Admitting: Family

## 2021-11-11 DIAGNOSIS — E118 Type 2 diabetes mellitus with unspecified complications: Secondary | ICD-10-CM

## 2021-11-11 MED ORDER — GLIPIZIDE ER 10 MG PO TB24: 10.0000 mg | ORAL_TABLET | Freq: Every day | ORAL | 3 refills | Status: DC

## 2021-11-25 ENCOUNTER — Other Ambulatory Visit: Payer: Self-pay | Admitting: Family

## 2021-11-25 DIAGNOSIS — E118 Type 2 diabetes mellitus with unspecified complications: Secondary | ICD-10-CM

## 2021-11-27 ENCOUNTER — Encounter: Payer: Self-pay | Admitting: Family

## 2021-11-27 ENCOUNTER — Ambulatory Visit: Payer: BC Managed Care – PPO | Admitting: Family

## 2021-11-27 VITALS — BP 118/65 | HR 85 | Temp 98.6°F | Ht 68.0 in | Wt 189.0 lb

## 2021-11-27 DIAGNOSIS — I10 Essential (primary) hypertension: Secondary | ICD-10-CM

## 2021-11-27 DIAGNOSIS — E118 Type 2 diabetes mellitus with unspecified complications: Secondary | ICD-10-CM | POA: Diagnosis not present

## 2021-11-27 DIAGNOSIS — E782 Mixed hyperlipidemia: Secondary | ICD-10-CM | POA: Diagnosis not present

## 2021-11-27 LAB — GLUCOSE, RANDOM: Glucose, Bld: 117 mg/dL — ABNORMAL HIGH (ref 70–99)

## 2021-11-27 LAB — LIPID PANEL
Cholesterol: 139 mg/dL (ref 0–200)
HDL: 40.4 mg/dL (ref >39.00)
LDL Cholesterol: 86 mg/dL (ref 0–99)
NonHDL: 98.5
Total CHOL/HDL Ratio: 3
Triglycerides: 63 mg/dL (ref 0.0–149.0)
VLDL: 12.6 mg/dL (ref 0.0–40.0)

## 2021-11-27 MED ORDER — EMPAGLIFLOZIN 25 MG PO TABS: 25.0000 mg | ORAL_TABLET | Freq: Every day | ORAL | 1 refills | Status: DC

## 2021-11-27 NOTE — Assessment & Plan Note (Signed)
Continue lisinopril 10 mg once daily Start monitoring your blood pressure daily, around the same time of day, for the next 2-3 weeks.  Ensure that you have rested for 30 minutes prior to checking your blood pressure. Record your readings and bring them to your next visit.

## 2021-11-27 NOTE — Assessment & Plan Note (Addendum)
Advised to continue atorvastatin 40 mg once daily Work on low chol diet exercise as tolerated. Ordered lipid panel pending results.

## 2021-11-27 NOTE — Assessment & Plan Note (Addendum)
Continue ozempic 1 mg weekly and glipizide 10 mg as well as metformin 1000 mg twice daily and jardiance 25 mg once daily.  Ordering fructosamine and fasting glucose today Based off of this, goal is to increase ozempic, decrease glipizide

## 2021-11-27 NOTE — Progress Notes (Signed)
Established Patient Office Visit  Subjective:  Patient ID: Kirsten Clark, female    DOB: Jun 30, 1970  Age: 51 y.o. MRN: [id number]  CC:  Chief Complaint  Patient presents with   Hypertension    Taking medications daily no symptoms     HPI Kirsten Clark is here today for follow up.   HTN: doing well, admits to not checking at home. Lisinopril 10 mg once daily. Today blood pressure 118/65.   HLD: still taking atorvastatin 40 mg.   DM2: doing well with weekly injection, 1 mg semaglutide. No constipation or diarrhea. No abdominal pain. Also taking metformin 1000 mg twice daily and glipizide 10 mg once daily. She is also taking daily jardiance 25 mg once daily.  Lab Results  Component Value Date   HGBA1C 7.3 (H) 10/27/2021  Left her glucose machine in [place], has been there since 10/1 so has not been checking her blood sugar at home.      Past Medical History:  Diagnosis Date   Anemia    Diabetes mellitus without complication ([place])    Hyperlipidemia    Hypertension    Vitamin B12 deficiency     Past Surgical History:  Procedure Laterality Date   ABDOMINAL HYSTERECTOMY     ABDOMINOPLASTY     TUBAL LIGATION  2000    Family History  Problem Relation Age of Onset   Diabetes Mother    Diabetes Paternal Grandfather     Social History   Socioeconomic History   Marital status: Single    Spouse name: Not on file   Number of children: 3   Years of education: 12   Highest education level: High school graduate  Occupational History   Occupation: [employer]  Tobacco Use   Smoking status: Never   Smokeless tobacco: Never  Vaping Use   Vaping Use: Never used  Substance and Sexual Activity   Alcohol use: Never   Drug use: Never   Sexual activity: Not Currently    Partners: Male  Other Topics Concern   Not on file  Social History Narrative   Not on file   Social Determinants of Health   Financial Resource Strain: Not on file  Food Insecurity: Not on file   Transportation Needs: Not on file  Physical Activity: Not on file  Stress: Not on file  Social Connections: Not on file  Intimate Partner Violence: Not on file    Outpatient Medications Prior to Visit  Medication Sig Dispense Refill   acyclovir cream (ZOVIRAX) 5 % Apply 1 application topically every 6 (six) hours as needed.     butalbital-acetaminophen-caffeine (FIORICET) 50-325-40 MG tablet Take 1 tablet by mouth every 6 (six) hours as needed for headache. 14 tablet 0   glipiZIDE (GLUCOTROL XL) 10 MG 24 hr tablet Take 1 tablet (10 mg total) by mouth daily. 90 tablet 3   glucose blood test strip Check fasting glucose qam 100 each 12   lisinopril (ZESTRIL) 10 MG tablet Take 1 tablet (10 mg total) by mouth daily. 90 tablet 3   metFORMIN (GLUCOPHAGE) 1000 MG tablet Take 1 tablet (1,000 mg total) by mouth 2 (two) times daily with a meal. 180 tablet 3   Semaglutide, 1 MG/DOSE, 4 MG/3ML SOPN Inject 1 mg as directed once a week. 9 mL 1   valACYclovir (VALTREX) 500 MG tablet Take 500 mg by mouth as needed.     atorvastatin (LIPITOR) 40 MG tablet Take 1 tablet (40 mg total) by mouth daily. [place]  tablet 1   No facility-administered medications prior to visit.    No Known Allergies        Objective:    Physical Exam Constitutional:      Appearance: Normal appearance. She is obese.  Cardiovascular:     Rate and Rhythm: Normal rate and regular rhythm.  Pulmonary:     Effort: Pulmonary effort is normal.  Musculoskeletal:     Right lower leg: No edema.     Left lower leg: No edema.  Neurological:     General: No focal deficit present.     Mental Status: She is alert and oriented to person, place, and time. Mental status is at baseline.  Psychiatric:        Mood and Affect: Mood normal.        Behavior: Behavior normal.        Thought Content: Thought content normal.        Judgment: Judgment normal.       BP 118/65   Pulse 85   Temp 98.6 F (37 C) (Oral)   Ht _0  (1.727 m)    Wt 189 lb (85.7 kg)   SpO2 98%   BMI 28.74 kg/m  Wt Readings from Last 3 Encounters:  11/27/21 189 lb (85.7 kg)  10/27/21 186 lb (84.4 kg)  07/21/21 191 lb 8 oz (86.9 kg)     Health Maintenance Due  Topic Date Due   Zoster Vaccines- Shingrix (1 of 2) Never done   COLONOSCOPY (Pts 45-71yr Insurance coverage will need to be confirmed)  Never done   OPHTHALMOLOGY EXAM  11/26/2021    There are no preventive care reminders to display for this patient.  Lab Results  Component Value Date   TSH 1.32 07/21/2021   Lab Results  Component Value Date   WBC 8.2 04/17/2021   HGB 12.4 04/17/2021   HCT 40.1 04/17/2021   MCV 75.9 (L) 04/17/2021   PLT 288.0 04/17/2021   Lab Results  Component Value Date   NA 138 10/27/2021   K 4.3 10/27/2021   CO2 27 10/27/2021   GLUCOSE 117 (H) 11/27/2021   BUN 18 10/27/2021   CREATININE 0.99 10/27/2021   BILITOT 0.3 10/27/2021   ALKPHOS 87 10/27/2021   AST 14 10/27/2021   ALT 19 10/27/2021   PROT 7.2 10/27/2021   ALBUMIN 4.3 10/27/2021   CALCIUM 9.7 10/27/2021   EGFR 79 01/10/2021   GFR 66.24 10/27/2021   Lab Results  Component Value Date   CHOL 139 11/27/2021   Lab Results  Component Value Date   HDL 40.40 11/27/2021   Lab Results  Component Value Date   LDLCALC 86 11/27/2021   Lab Results  Component Value Date   TRIG 63.0 11/27/2021   Lab Results  Component Value Date   CHOLHDL 3 11/27/2021   Lab Results  Component Value Date   HGBA1C 7.3 (H) 10/27/2021      Assessment & Plan:   Problem List Items Addressed This Visit       Cardiovascular and Mediastinum   Essential hypertension - Primary    Continue lisinopril 10 mg once daily Start monitoring your blood pressure daily, around the same time of day, for the next 2-3 weeks.  Ensure that you have rested for 30 minutes prior to checking your blood pressure. Record your readings and bring them to your next visit.       Relevant Orders   Glucose, random  (Completed)  Endocrine   Diabetes mellitus type 2 with complications (HCC)     Continue ozempic 1 mg weekly and glipizide 10 mg as well as metformin 1000 mg twice daily and jardiance 25 mg once daily.  Ordering fructosamine and fasting glucose today Based off of this, goal is to increase ozempic, decrease glipizide       Relevant Medications   empagliflozin (JARDIANCE) 25 MG TABS tablet   Other Relevant Orders   Glucose, random (Completed)   Fructosamine     Other   Mixed hyperlipidemia    Advised to continue atorvastatin 40 mg once daily Work on low chol diet exercise as tolerated. Ordered lipid panel pending results.       Relevant Orders   Lipid panel (Completed)    Meds ordered this encounter  Medications   empagliflozin (JARDIANCE) 25 MG TABS tablet    Sig: Take 1 tablet (25 mg total) by mouth daily before breakfast.    Dispense:  90 tablet    Refill:  1    Order Specific Question:   Supervising Provider    Answer:   Diona Browner, AMY E [[id number]]    Follow-up: Return in about 3 months (around 02/27/2022) for f/u diabetes.    Eugenia Pancoast, FNP

## 2021-11-28 NOTE — Telephone Encounter (Signed)
Patient came by office to ask about this medication again. She stated that she's still suppose to be taking it but I see that you all have been in communication about this matter. She would like a phone call to know if she will be able to get it prescribed to her again?

## 2021-12-02 LAB — FRUCTOSAMINE: Fructosamine: 239 umol/L (ref 205–285)

## 2022-02-27 ENCOUNTER — Ambulatory Visit: Payer: BC Managed Care – PPO | Admitting: Family

## 2022-03-05 ENCOUNTER — Encounter: Payer: Self-pay | Admitting: Family

## 2022-03-05 ENCOUNTER — Ambulatory Visit: Payer: BC Managed Care – PPO | Admitting: Family

## 2022-03-05 VITALS — BP 118/74 | HR 91 | Temp 98.6°F | Ht 68.0 in | Wt 181.0 lb

## 2022-03-05 DIAGNOSIS — I10 Essential (primary) hypertension: Secondary | ICD-10-CM | POA: Diagnosis not present

## 2022-03-05 DIAGNOSIS — Z79899 Other long term (current) drug therapy: Secondary | ICD-10-CM

## 2022-03-05 DIAGNOSIS — E782 Mixed hyperlipidemia: Secondary | ICD-10-CM | POA: Diagnosis not present

## 2022-03-05 DIAGNOSIS — E663 Overweight: Secondary | ICD-10-CM

## 2022-03-05 DIAGNOSIS — Z6827 Body mass index (BMI) 27.0-27.9, adult: Secondary | ICD-10-CM

## 2022-03-05 DIAGNOSIS — E118 Type 2 diabetes mellitus with unspecified complications: Secondary | ICD-10-CM | POA: Diagnosis not present

## 2022-03-05 LAB — MICROALBUMIN / CREATININE URINE RATIO
Creatinine,U: 176.8 mg/dL
Microalb Creat Ratio: 0.4 mg/g (ref 0.0–30.0)
Microalb, Ur: <0.7 mg/dL (ref 0.0–1.9)

## 2022-03-05 LAB — POCT GLYCOSYLATED HEMOGLOBIN (HGB A1C): Hemoglobin A1C: 5.9 % — AB (ref 4.0–5.6)

## 2022-03-05 NOTE — Progress Notes (Signed)
Established Patient Office Visit  Subjective:      CC:  Chief Complaint  Patient presents with   Diabetes    HPI: Kirsten Clark is a 52 y.o. female presenting on 03/05/2022 for Diabetes .  HLD: on lisinopril 10 mg once daily. Tolerating well.  Blood pressure stable today.   DM2: on ozempic 1 mg and glipizide 10 mg , also on metformin 1000 mg bid and jardiance 25 mg daily. States nausea around injection of ozempic. Starting to lift weight in the gym and on treadmill at times. Working on butt and abdomen.   Wt Readings from Last 3 Encounters:  03/05/22 181 lb (82.1 kg)  11/27/21 189 lb (85.7 kg)  10/27/21 186 lb (84.4 kg)   Lab Results  Component Value Date   HGBA1C 5.9 (A) 03/05/2022     HLD: on atorvastatin 40 mg once daily.        Social history:  Relevant past medical, surgical, family and social history reviewed and updated as indicated. Interim medical history since our last visit reviewed.  Allergies and medications reviewed and updated.  DATA REVIEWED: CHART IN EPIC     ROS: Negative unless specifically indicated above in HPI.    Current Outpatient Medications:    acyclovir cream (ZOVIRAX) 5 %, Apply 1 application topically every 6 (six) hours as needed., Disp: , Rfl:    atorvastatin (LIPITOR) 40 MG tablet, Take 1 tablet (40 mg total) by mouth daily., Disp: 90 tablet, Rfl: 1   cyanocobalamin (VITAMIN B12) 500 MCG tablet, Take 500 mcg by mouth daily., Disp: , Rfl:    empagliflozin (JARDIANCE) 25 MG TABS tablet, Take 1 tablet (25 mg total) by mouth daily before breakfast., Disp: 90 tablet, Rfl: 1   glipiZIDE (GLUCOTROL XL) 10 MG 24 hr tablet, Take 1 tablet (10 mg total) by mouth daily., Disp: 90 tablet, Rfl: 3   glucose blood test strip, Check fasting glucose qam, Disp: 100 each, Rfl: 12   lisinopril (ZESTRIL) 10 MG tablet, Take 1 tablet (10 mg total) by mouth daily., Disp: 90 tablet, Rfl: 3   metFORMIN (GLUCOPHAGE) 1000 MG tablet, Take 1 tablet  (1,000 mg total) by mouth 2 (two) times daily with a meal., Disp: 180 tablet, Rfl: 3   Semaglutide, 1 MG/DOSE, 4 MG/3ML SOPN, Inject 1 mg as directed once a week., Disp: 9 mL, Rfl: 1   valACYclovir (VALTREX) 500 MG tablet, Take 500 mg by mouth as needed., Disp: , Rfl:    butalbital-acetaminophen-caffeine (FIORICET) 50-325-40 MG tablet, Take 1 tablet by mouth every 6 (six) hours as needed for headache. (Patient not taking: Reported on 03/05/2022), Disp: 14 tablet, Rfl: 0      Objective:    BP 118/74   Pulse 91   Temp 98.6 F (37 C) (Temporal)   Ht 5\' 8"  (1.727 m)   Wt 181 lb (82.1 kg)   SpO2 99%   BMI 27.52 kg/m   Wt Readings from Last 3 Encounters:  03/05/22 181 lb (82.1 kg)  11/27/21 189 lb (85.7 kg)  10/27/21 186 lb (84.4 kg)    Physical Exam Constitutional:      General: She is not in acute distress.    Appearance: Normal appearance. She is normal weight. She is not ill-appearing, toxic-appearing or diaphoretic.  HENT:     Head: Normocephalic.  Cardiovascular:     Rate and Rhythm: Normal rate and regular rhythm.  Pulmonary:     Effort: Pulmonary effort is normal.  Musculoskeletal:  General: Normal range of motion.  Neurological:     General: No focal deficit present.     Mental Status: She is alert and oriented to person, place, and time. Mental status is at baseline.  Psychiatric:        Mood and Affect: Mood normal.        Behavior: Behavior normal.        Thought Content: Thought content normal.        Judgment: Judgment normal.           Assessment & Plan:  Diabetes mellitus type 2 with complications (HCC) Assessment & Plan: Poc A1c today, improved.  Urine microalbumin today.  Advised to stop glipizide  Continue ozempic 1 mg and also metformin 1000 mg bid   Orders: -     POCT glycosylated hemoglobin (Hb A1C) -     Microalbumin / creatinine urine ratio -     Hemoglobin A1c; Future  Essential hypertension Assessment & Plan: Pt advised of the  following:  Continue medication as prescribed. Monitor blood pressure periodically and/or when you feel symptomatic. Goal is <130/90 on average. Ensure that you have rested for 30 minutes prior to checking your blood pressure. Record your readings and bring them to your next visit if necessary.work on a low sodium diet. Continue lisinopril 10 mg   Orders: -     Microalbumin / creatinine urine ratio  Mixed hyperlipidemia Assessment & Plan: Ordered lipid panel, pending results. Work on low cholesterol diet and exercise as tolerated Continue lipitor 10 mg once daily   Orders: -     Lipid panel; Future  Overweight with body mass index (BMI) of 27 to 27.9 in adult Assessment & Plan: Pt advised to work on diet and exercise as tolerated       On statin therapy -     Comprehensive metabolic panel; Future     Return in about 6 months (around 09/03/2022) for f/u CPE.  Eugenia Pancoast, MSN, APRN, FNP-C [facility]

## 2022-03-05 NOTE — Assessment & Plan Note (Signed)
Pt advised to work on diet and exercise as tolerated  

## 2022-03-05 NOTE — Assessment & Plan Note (Signed)
Ordered lipid panel, pending results. Work on low cholesterol diet and exercise as tolerated Continue lipitor 10 mg once daily

## 2022-03-05 NOTE — Assessment & Plan Note (Addendum)
Poc A1c today, improved.  Urine microalbumin today.  Advised to stop glipizide  Continue ozempic 1 mg and also metformin 1000 mg bid

## 2022-03-05 NOTE — Assessment & Plan Note (Addendum)
Pt advised of the following:  Continue medication as prescribed. Monitor blood pressure periodically and/or when you feel symptomatic. Goal is <130/90 on average. Ensure that you have rested for 30 minutes prior to checking your blood pressure. Record your readings and bring them to your next visit if necessary.work on a low sodium diet. Continue lisinopril 10 mg

## 2022-03-05 NOTE — Patient Instructions (Signed)
  Decrease glipizide to one tablet once daily for one week then d/c completely.  Continue jardiance and continue ozempic.    Regards,   Eugenia Pancoast FNP-C

## 2022-03-08 ENCOUNTER — Other Ambulatory Visit: Payer: Self-pay | Admitting: Family

## 2022-03-08 DIAGNOSIS — E782 Mixed hyperlipidemia: Secondary | ICD-10-CM

## 2022-04-08 DIAGNOSIS — Z1211 Encounter for screening for malignant neoplasm of colon: Secondary | ICD-10-CM

## 2022-04-15 NOTE — Telephone Encounter (Signed)
The patient stopped by the office today to get information on her referral for colon cancer screening. She said a referral was placed, but I could not find one. Does the patient need to come in for a OV or does a referral just needs to be started?

## 2022-04-15 NOTE — Telephone Encounter (Signed)
Patient would like to set up an appointment for colon cancer screening. Please reach out to the patient to discuss options as patient has limitations with transportation

## 2022-04-20 NOTE — Telephone Encounter (Signed)
Please advise pt now referral is placed. I just got to messages today due to it being over the weekend.   Please let us know if you have not heard back within 2 weeks about the referral.

## 2022-04-21 NOTE — Telephone Encounter (Signed)
Message sent via My chart

## 2022-04-22 ENCOUNTER — Telehealth: Payer: Self-pay

## 2022-04-22 NOTE — Telephone Encounter (Signed)
Patient stated that she could only schedule between now and Friday.  Unfortunately that will not allow time needed to prepare and prep for having a colonoscopy.  I informed her that I will send her out a reminder letter and when her schedule allows she can call to schedule.  Thanks, [place], [place]

## 2022-04-27 ENCOUNTER — Telehealth: Payer: Self-pay | Admitting: Family

## 2022-04-27 DIAGNOSIS — E118 Type 2 diabetes mellitus with unspecified complications: Secondary | ICD-10-CM

## 2022-04-27 NOTE — Telephone Encounter (Signed)
Prescription Request  04/27/2022  LOV: 03/05/2022  What is the name of the medication or equipment? Ozempic   Have you contacted your pharmacy to request a refill? Yes  Pt stated she reached out to pharmacy to refill. Pt stated she couldn't find meds on her med list via mychart.   Which pharmacy would you like this sent to?  [id number] [place], [address] AT [facility] [address] [place] [address] Phone: [phone] Fax: [phone]    Patient notified that their request is being sent to the clinical staff for review and that they should receive a response within 2 business days.   Please advise at Mobile [phone] (mobile)

## 2022-04-28 ENCOUNTER — Encounter: Payer: Self-pay | Admitting: Family

## 2022-04-28 MED ORDER — SEMAGLUTIDE (1 MG/DOSE) 4 MG/3ML ~~LOC~~ SOPN: 1.0000 mg | PEN_INJECTOR | SUBCUTANEOUS | 1 refills | Status: DC

## 2022-04-28 NOTE — Telephone Encounter (Signed)
See prior message

## 2022-04-28 NOTE — Addendum Note (Signed)
Addended by: Vaughan Browner on: 04/28/2022 12:11 PM   Modules accepted: Orders

## 2022-05-11 ENCOUNTER — Telehealth: Payer: Self-pay | Admitting: Family

## 2022-05-11 NOTE — Telephone Encounter (Signed)
Patient came by to drop off paperwork. Placed in Tabitha's box up front.

## 2022-05-11 NOTE — Telephone Encounter (Signed)
Form received and placed in Kirsten Clark's box in her office. 

## 2022-05-13 NOTE — Telephone Encounter (Signed)
Pt is overdue for lab work, last diabetic profile workup was in the fall. We need an update, last A1c was improved however so I am curious if she is even still experiencing these besides (the hypoglycemic episodes) as she should not be with this better control and if she is then we need to d/c her glipizide and increase her semiglutide injection.   Also if these are still ongoing, does she get CDL yearly?  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

## 2022-05-14 NOTE — Telephone Encounter (Signed)
Spoke with the patient and scheduled a f/u appt for 05/28/22. Forms will not be completed until she is seen in the office. Pt understood, but was not happy.

## 2022-05-15 ENCOUNTER — Encounter: Payer: Self-pay | Admitting: Family

## 2022-05-25 ENCOUNTER — Other Ambulatory Visit: Payer: Self-pay | Admitting: Family

## 2022-05-25 DIAGNOSIS — E118 Type 2 diabetes mellitus with unspecified complications: Secondary | ICD-10-CM

## 2022-05-25 NOTE — Telephone Encounter (Signed)
Ml/ Refill: Semaglutide, 1 MG/DOSE, 4 MG/3ML SOPN    LR- 04/28/22 ( 35ml/ 1 refill) LV- 03/05/22 NV- 05/28/22

## 2022-05-25 NOTE — Telephone Encounter (Signed)
Prescription Request  05/25/2022  LOV: 03/05/2022  What is the name of the medication or equipment?  Semaglutide, 1 MG/DOSE, 4 MG/3ML SOPN   Have you contacted your pharmacy to request a refill? Yes   Which pharmacy would you like this sent to?  [facility] [id number] [place], [place] - [address] AT [facility] [address] [place] [address] Phone: [phone] Fax: [phone]   Patient is out of medication  Patient notified that their request is being sent to the clinical staff for review and that they should receive a response within 2 business days.   Please advise at [facility] [phone]

## 2022-05-26 MED ORDER — SEMAGLUTIDE (1 MG/DOSE) 4 MG/3ML ~~LOC~~ SOPN
1.0000 mg | PEN_INJECTOR | SUBCUTANEOUS | 1 refills | Status: DC
Start: 2022-05-26 — End: 2022-09-04

## 2022-05-28 ENCOUNTER — Other Ambulatory Visit: Payer: BC Managed Care – PPO

## 2022-05-28 ENCOUNTER — Encounter: Payer: Self-pay | Admitting: Family

## 2022-05-28 ENCOUNTER — Ambulatory Visit: Payer: BC Managed Care – PPO | Admitting: Family

## 2022-05-28 VITALS — BP 138/82 | HR 72 | Temp 98.2°F | Ht 68.0 in | Wt 178.6 lb

## 2022-05-28 DIAGNOSIS — E118 Type 2 diabetes mellitus with unspecified complications: Secondary | ICD-10-CM

## 2022-05-28 DIAGNOSIS — E663 Overweight: Secondary | ICD-10-CM | POA: Diagnosis not present

## 2022-05-28 DIAGNOSIS — Z6827 Body mass index (BMI) 27.0-27.9, adult: Secondary | ICD-10-CM

## 2022-05-28 DIAGNOSIS — G44221 Chronic tension-type headache, intractable: Secondary | ICD-10-CM | POA: Diagnosis not present

## 2022-05-28 LAB — POCT GLYCOSYLATED HEMOGLOBIN (HGB A1C): Hemoglobin A1C: 6.4 % — AB (ref 4.0–5.6)

## 2022-05-28 NOTE — Progress Notes (Addendum)
Established Patient Office Visit  Subjective:      CC:  Chief Complaint  Patient presents with   Migraine   Hypoglycemia    HPI: Kirsten Clark is a 52 y.o. female presenting on 05/28/2022 for Migraine and Hypoglycemia . Lab Results  Component Value Date   HGBA1C 6.7 (H) 06/03/2022     Hypoglycemia episodes per pt, she states she 'feels funny' when these occur and she has to call out from work. She states at times she will feel this at work but doesn't check her sugar while she is at work because she doesn't have her glucose   Wt Readings from Last 3 Encounters:  09/04/22 178 lb (80.7 kg)  05/28/22 178 lb 9.6 oz (81 kg)  03/05/22 181 lb (82.1 kg)   Overweight, not in the gym over the last few weeks. Prior to that was in the gym a few times a week. Diet is not always great, still eats poor options at times. She does try to eat a good amount of fish and trying to switch to air fryer.  Denies nausea or constipation with ozempic. Tolerating well.   Chronic tension headaches, no longer experiencing as frequent. When they occur they last about a few hours at a time.   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 She states that she told them of hypoglycemic episodes and also the migraines.      Social history:  Relevant past medical, surgical, family and social history reviewed and updated as indicated. Interim medical history since our last visit reviewed.  Allergies and medications reviewed and updated.  DATA REVIEWED: CHART IN EPIC     ROS: Negative unless specifically indicated above in HPI.    Current Outpatient Medications:    acyclovir cream (ZOVIRAX) 5 %, Apply 1 application topically every 6 (six) hours as needed., Disp: , Rfl:    atorvastatin (LIPITOR) 40 MG tablet, TAKE ONE TABLET BY MOUTH ONE TIME DAILY, Disp: 90 tablet, Rfl: 3   cyanocobalamin (VITAMIN B12) 500 MCG  tablet, Take 500 mcg by mouth daily., Disp: , Rfl:    glucose blood test strip, Check fasting glucose qam, Disp: 100 each, Rfl: 12   lisinopril (ZESTRIL) 10 MG tablet, Take 1 tablet (10 mg total) by mouth daily., Disp: 90 tablet, Rfl: 3   Semaglutide, 1 MG/DOSE, 4 MG/3ML SOPN, Inject 1 mg as directed once a week., Disp: 9 mL, Rfl: 1   valACYclovir (VALTREX) 500 MG tablet, Take 500 mg by mouth as needed., Disp: , Rfl:    JARDIANCE 25 MG TABS tablet, TAKE ONE TABLET BY MOUTH ONE TIME DAILY BEFORE BREAKFAST, Disp: 90 tablet, Rfl: 1   metFORMIN (GLUCOPHAGE) 1000 MG tablet, TAKE ONE TABLET BY MOUTH TWICE A DAY WITH MEAL, Disp: 180 tablet, Rfl: 3      Objective:    BP 138/82   Pulse 72   Temp 98.2 F (36.8 C) (Temporal)   Ht 5\' 8"  (1.727 m)   Wt 178 lb 9.6 oz (81 kg)   SpO2 98%   BMI 27.16 kg/m   Wt Readings from Last 3 Encounters:  09/04/22 178 lb (80.7 kg)  05/28/22 178 lb 9.6 oz (81 kg)  03/05/22 181 lb (82.1 kg)    Physical Exam  Physical Exam Constitutional:      General: not in acute distress.    Appearance: Normal appearance.  normal weight. is not ill-appearing, toxic-appearing or diaphoretic.  Cardiovascular:     Rate and Rhythm: Normal rate.  Pulmonary:     Effort: Pulmonary effort is normal.  Musculoskeletal:        General: Normal range of motion.  Neurological:     General: No focal deficit present.     Mental Status: alert and oriented to person, place, and time. Mental status is at baseline.  Psychiatric:        Mood and Affect: Mood normal.        Behavior: Behavior normal.        Thought Content: Thought content normal.        Judgment: Judgment normal.        Assessment & Plan:  Diabetes mellitus type 2 with complications [facility]) Assessment & Plan: Try to eat every 2-3 hours to avoid hypoglycemia, these seem to be improving per pt with better control of diabetes hga1c.  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    Orders: -     POCT glycosylated hemoglobin (Hb A1C)  Chronic tension-type headache, intractable Assessment & Plan: improving   Overweight with body mass index (BMI) of 27 to 27.9 in adult Assessment & Plan: Pt advised to work on diet and exercise as tolerated       Return for f/u CPE.  Mort Sawyers, MSN, APRN, FNP-C [facility]

## 2022-06-03 ENCOUNTER — Other Ambulatory Visit: Payer: BC Managed Care – PPO

## 2022-06-03 ENCOUNTER — Other Ambulatory Visit (INDEPENDENT_AMBULATORY_CARE_PROVIDER_SITE_OTHER): Payer: BC Managed Care – PPO

## 2022-06-03 DIAGNOSIS — Z79899 Other long term (current) drug therapy: Secondary | ICD-10-CM

## 2022-06-03 DIAGNOSIS — E118 Type 2 diabetes mellitus with unspecified complications: Secondary | ICD-10-CM

## 2022-06-03 DIAGNOSIS — E782 Mixed hyperlipidemia: Secondary | ICD-10-CM | POA: Diagnosis not present

## 2022-06-03 LAB — LIPID PANEL
Cholesterol: 169 mg/dL (ref 0–200)
HDL: 45.6 mg/dL (ref >39.00)
LDL Cholesterol: 104 mg/dL — ABNORMAL HIGH (ref 0–99)
NonHDL: 123.32
Total CHOL/HDL Ratio: 4
Triglycerides: 97 mg/dL (ref 0.0–149.0)
VLDL: 19.4 mg/dL (ref 0.0–40.0)

## 2022-06-03 LAB — COMPREHENSIVE METABOLIC PANEL
ALT: 23 U/L (ref 0–35)
AST: 18 U/L (ref 0–37)
Albumin: 4.1 g/dL (ref 3.5–5.2)
Alkaline Phosphatase: 85 U/L (ref 39–117)
BUN: 13 mg/dL (ref 6–23)
CO2: 32 mEq/L (ref 19–32)
Calcium: 9.6 mg/dL (ref 8.4–10.5)
Chloride: 100 mEq/L (ref 96–112)
Creatinine, Ser: 0.8 mg/dL (ref 0.40–1.20)
GFR: 85.18 mL/min (ref >60.00)
Glucose, Bld: 132 mg/dL — ABNORMAL HIGH (ref 70–99)
Potassium: 4.3 mEq/L (ref 3.5–5.1)
Sodium: 139 mEq/L (ref 135–145)
Total Bilirubin: 0.5 mg/dL (ref 0.2–1.2)
Total Protein: 7.6 g/dL (ref 6.0–8.3)

## 2022-06-03 LAB — HEMOGLOBIN A1C: Hgb A1c MFr Bld: 6.7 % — ABNORMAL HIGH (ref 4.6–6.5)

## 2022-06-15 ENCOUNTER — Other Ambulatory Visit: Payer: Self-pay | Admitting: Family

## 2022-06-15 DIAGNOSIS — E118 Type 2 diabetes mellitus with unspecified complications: Secondary | ICD-10-CM

## 2022-06-15 NOTE — Telephone Encounter (Signed)
Did not see on last note if you wanted patient to continue on this?

## 2022-08-20 ENCOUNTER — Other Ambulatory Visit: Payer: Self-pay | Admitting: Family

## 2022-08-20 DIAGNOSIS — E118 Type 2 diabetes mellitus with unspecified complications: Secondary | ICD-10-CM

## 2022-08-20 DIAGNOSIS — Z6828 Body mass index (BMI) 28.0-28.9, adult: Secondary | ICD-10-CM

## 2022-09-04 ENCOUNTER — Encounter: Payer: Self-pay | Admitting: Family

## 2022-09-04 ENCOUNTER — Ambulatory Visit (INDEPENDENT_AMBULATORY_CARE_PROVIDER_SITE_OTHER): Payer: BC Managed Care – PPO | Admitting: Family

## 2022-09-04 VITALS — BP 122/86 | HR 87 | Temp 97.1°F | Ht 68.25 in | Wt 178.0 lb

## 2022-09-04 DIAGNOSIS — N898 Other specified noninflammatory disorders of vagina: Secondary | ICD-10-CM

## 2022-09-04 DIAGNOSIS — I10 Essential (primary) hypertension: Secondary | ICD-10-CM | POA: Diagnosis not present

## 2022-09-04 DIAGNOSIS — Z1231 Encounter for screening mammogram for malignant neoplasm of breast: Secondary | ICD-10-CM

## 2022-09-04 DIAGNOSIS — E118 Type 2 diabetes mellitus with unspecified complications: Secondary | ICD-10-CM | POA: Diagnosis not present

## 2022-09-04 LAB — POCT GLYCOSYLATED HEMOGLOBIN (HGB A1C): Hemoglobin A1C: 6.8 % — AB (ref 4.0–5.6)

## 2022-09-04 MED ORDER — LISINOPRIL 10 MG PO TABS: 10.0000 mg | ORAL_TABLET | Freq: Every day | ORAL | 3 refills | Status: AC

## 2022-09-04 MED ORDER — SEMAGLUTIDE (1 MG/DOSE) 4 MG/3ML ~~LOC~~ SOPN
1.0000 mg | PEN_INJECTOR | SUBCUTANEOUS | 1 refills | Status: AC
Start: 2022-09-04 — End: ?

## 2022-09-04 NOTE — Progress Notes (Signed)
Subjective:  Patient ID: Kirsten Clark, female    DOB: January 15, 1971  Age: 52 y.o. MRN: [id number]  Patient Care Team: Mort Sawyers, FNP as PCP - General (Family Medicine)   CC:  Chief Complaint  Patient presents with  . Annual Exam    Requests 90-day Jardiance rx due to pt moving to [place].     HPI Kirsten Clark is a 52 y.o. female who presents today for an annual physical exam. She reports consuming a general diet.  Not exercising as much anymore  She generally feels well. She reports sleeping fairly well, she will sleep most of the day, she does work nights. She does have additional problems to discuss today.   Vision:Within last year Dental:No regular dental care STD:The patient denies history of sexually transmitted disease.  Mammogram: 01/01/21, gynecologist orders Dr. Arby Barrette Last pap: 12/24/20 Cologuard: 05/25/21 negative  Wt Readings from Last 3 Encounters:  09/04/22 178 lb (80.7 kg)  05/28/22 178 lb 9.6 oz (81 kg)  03/05/22 181 lb (82.1 kg)    Pt is with acute concerns. She has c/o vaginal discharge, about one week ago. No vaginal odor. There is some vaginal itching. The discharge is white thin milky. Did have a new sexual partner about one month ago.    Lab Results  Component Value Date   HGBA1C 6.7 (H) 06/03/2022     Advanced Directives Patient does not have advanced directives  .   DEPRESSION SCREENING    09/04/2022    9:22 AM 05/28/2022   10:09 AM 03/05/2022    9:47 AM 01/07/2021    3:18 PM  PHQ 2/9 Scores  PHQ - 2 Score 0 0 0 0  PHQ- 9 Score 1  0 1     ROS: Negative unless specifically indicated above in HPI.    Current Outpatient Medications:  .  acyclovir cream (ZOVIRAX) 5 %, Apply 1 application topically every 6 (six) hours as needed., Disp: , Rfl:  .  atorvastatin (LIPITOR) 40 MG tablet, TAKE ONE TABLET BY MOUTH ONE TIME DAILY, Disp: 90 tablet, Rfl: 3 .  cyanocobalamin (VITAMIN B12) 500 MCG tablet, Take 500 mcg by mouth daily., Disp: ,  Rfl:  .  glucose blood test strip, Check fasting glucose qam, Disp: 100 each, Rfl: 12 .  JARDIANCE 25 MG TABS tablet, TAKE ONE TABLET BY MOUTH ONE TIME DAILY BEFORE BREAKFAST, Disp: 90 tablet, Rfl: 1 .  metFORMIN (GLUCOPHAGE) 1000 MG tablet, TAKE ONE TABLET BY MOUTH TWICE A DAY WITH MEAL, Disp: 180 tablet, Rfl: 3 .  valACYclovir (VALTREX) 500 MG tablet, Take 500 mg by mouth as needed., Disp: , Rfl:  .  lisinopril (ZESTRIL) 10 MG tablet, Take 1 tablet (10 mg total) by mouth daily., Disp: 90 tablet, Rfl: 3 .  Semaglutide, 1 MG/DOSE, 4 MG/3ML SOPN, Inject 1 mg as directed once a week., Disp: 9 mL, Rfl: 1    Objective:    BP 122/86   Pulse 87   Temp (!) 97.1 F (36.2 C) (Temporal)   Ht 5' 8.25" (1.734 m)   Wt 178 lb (80.7 kg)   SpO2 100%   BMI 26.87 kg/m   BP Readings from Last 3 Encounters:  09/04/22 122/86  05/28/22 138/82  03/05/22 118/74      Physical Exam Constitutional:      General: She is not in acute distress.    Appearance: Normal appearance. She is normal weight. She is not ill-appearing.  HENT:     Head:  Normocephalic.     Right Ear: Tympanic membrane normal.     Left Ear: Tympanic membrane normal.     Nose: Nose normal.     Mouth/Throat:     Mouth: Mucous membranes are moist.  Eyes:     Extraocular Movements: Extraocular movements intact.     Pupils: Pupils are equal, round, and reactive to light.  Cardiovascular:     Rate and Rhythm: Normal rate and regular rhythm.  Pulmonary:     Effort: Pulmonary effort is normal.     Breath sounds: Normal breath sounds.  Abdominal:     General: Abdomen is flat. Bowel sounds are normal.     Palpations: Abdomen is soft.     Tenderness: There is no guarding or rebound.  Musculoskeletal:        General: Normal range of motion.     Cervical back: Normal range of motion.  Skin:    General: Skin is warm.     Capillary Refill: Capillary refill takes less than 2 seconds.  Neurological:     General: No focal deficit  present.     Mental Status: She is alert.  Psychiatric:        Mood and Affect: Mood normal.        Behavior: Behavior normal.        Thought Content: Thought content normal.        Judgment: Judgment normal.         Assessment & Plan:  Vaginal discharge -     WET PREP BY MOLECULAR PROBE -     Trichomonas vaginalis, RNA -     GC/Chlamydia Probe Amp  Diabetes mellitus type 2 with complications (HCC) -     POCT glycosylated hemoglobin (Hb A1C) -     Semaglutide (1 MG/DOSE); Inject 1 mg as directed once a week.  Dispense: 9 mL; Refill: 1  Screening mammogram for breast cancer -     3D Screening Mammogram, Left and Right; Future  Essential hypertension -     Lisinopril; Take 1 tablet (10 mg total) by mouth daily.  Dispense: 90 tablet; Refill: 3      Follow-up: No follow-ups on file.   Mort Sawyers, FNP

## 2022-09-04 NOTE — Assessment & Plan Note (Signed)
Try to eat every 2-3 hours to avoid hypoglycemia, these seem to be improving per pt with better control of diabetes hga1c.  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

## 2022-09-04 NOTE — Assessment & Plan Note (Signed)
Pt advised to work on diet and exercise as tolerated  

## 2022-09-04 NOTE — Assessment & Plan Note (Signed)
improving

## 2022-09-04 NOTE — Addendum Note (Signed)
Addended by: Mort Sawyers on: 09/04/2022 09:20 AM   Modules accepted: Level of Service

## 2022-09-04 NOTE — Patient Instructions (Signed)
  I have sent an electronic order over to your preferred location for the following:   []   2D Mammogram  []   3D Mammogram  []   Bone Density   Please give this center a call to get scheduled at your convenience.  [x]   [facility]  [address] [place] [address]  [phone]  Make sure to wear two piece  clothing  No lotions powders or deodorants the day of the appointment Make sure to bring picture ID and insurance card.  Bring list of medications you are currently taking including any supplements.

## 2022-09-04 NOTE — Assessment & Plan Note (Signed)
Ordered hga1c today pending results. Work on diabetic diet and exercise as tolerated. Yearly foot exam, and annual eye exam.   

## 2022-09-07 DIAGNOSIS — N898 Other specified noninflammatory disorders of vagina: Secondary | ICD-10-CM | POA: Insufficient documentation

## 2022-09-07 NOTE — Assessment & Plan Note (Signed)
Wet prep ordered as well as r/o trich GC pending results.

## 2022-09-07 NOTE — Assessment & Plan Note (Signed)
 Continue lisinopril 10 mg once daily  Stable

## 2022-09-08 ENCOUNTER — Other Ambulatory Visit: Payer: Self-pay | Admitting: Family

## 2022-09-08 DIAGNOSIS — N760 Acute vaginitis: Secondary | ICD-10-CM

## 2022-09-08 MED ORDER — METRONIDAZOLE 500 MG PO TABS
500.0000 mg | ORAL_TABLET | Freq: Two times a day (BID) | ORAL | 0 refills | Status: AC
Start: 2022-09-08 — End: 2022-09-15

## 2022-09-17 ENCOUNTER — Ambulatory Visit
Admission: RE | Admit: 2022-09-17 | Discharge: 2022-09-17 | Disposition: A | Discharge status: Home / Self Care | Payer: BC Managed Care – PPO | Source: Ambulatory Visit | Attending: Family | Admitting: Family

## 2022-09-17 DIAGNOSIS — Z1231 Encounter for screening mammogram for malignant neoplasm of breast: Secondary | ICD-10-CM | POA: Insufficient documentation

## 2022-09-18 ENCOUNTER — Other Ambulatory Visit: Payer: Self-pay | Admitting: *Deleted

## 2022-09-18 ENCOUNTER — Inpatient Hospital Stay
Admission: RE | Admit: 2022-09-18 | Discharge: 2022-09-18 | Disposition: A | Discharge status: Home / Self Care | Payer: Self-pay | Source: Ambulatory Visit | Attending: Family | Admitting: Family

## 2022-09-18 DIAGNOSIS — Z1231 Encounter for screening mammogram for malignant neoplasm of breast: Secondary | ICD-10-CM

## 2022-10-02 ENCOUNTER — Telehealth: Payer: Self-pay | Admitting: Family

## 2022-10-02 NOTE — Telephone Encounter (Signed)
Patient called in and wanted to speak with someone regarding her medication. She can be reached at [phone]. Thank you!

## 2022-10-02 NOTE — Telephone Encounter (Signed)
Spoke with pt. States that she believes that she might have a yeast infection. Reports having a thick white discharge and vaginal itching. Pt no longer lives her in [place], she has moved to [place]. Advised her that this is something that she would need evaluated in person. Pt states that she will go to an urgent care in [place]. Nothing further is needed at this time.

## 2022-11-10 ENCOUNTER — Telehealth: Payer: Self-pay | Admitting: Family

## 2022-11-10 DIAGNOSIS — N898 Other specified noninflammatory disorders of vagina: Secondary | ICD-10-CM

## 2022-11-10 DIAGNOSIS — Z113 Encounter for screening for infections with a predominantly sexual mode of transmission: Secondary | ICD-10-CM

## 2022-11-10 NOTE — Telephone Encounter (Signed)
Patient called in stating that labs that she had done on 09/04/2022 for trich and chlamydia ,her insurance is charging her for because they are saying that it wasn't medical necessary for her to have,even though she was seen in the office for vaginal discharge. She said that it was coded wrong and would like to know if it could be resent to her insurance reflecting the correct codes so that they will cover.

## 2022-11-11 DIAGNOSIS — N898 Other specified noninflammatory disorders of vagina: Secondary | ICD-10-CM | POA: Insufficient documentation

## 2022-11-11 DIAGNOSIS — Z113 Encounter for screening for infections with a predominantly sexual mode of transmission: Secondary | ICD-10-CM | POA: Insufficient documentation

## 2022-11-11 NOTE — Telephone Encounter (Signed)
Would it be possible to resubmit testing for trichomonas and chlamydia with icd 10 code z11.3 along with vaginal discharge N89.8

## 2023-03-27 IMAGING — US US THYROID
1 series · 14 of 25 positions shown · non-contrast
Comparison: None.

CLINICAL DATA: 50-year-old female with a history of thyroid goiter

EXAM:
THYROID ULTRASOUND
TECHNIQUE: Ultrasound examination of the thyroid gland and adjacent soft
tissues was performed.

[Series 1: us thyroid · 0.07mm/px · 14 of 35 slices shown]
[im 1/35]
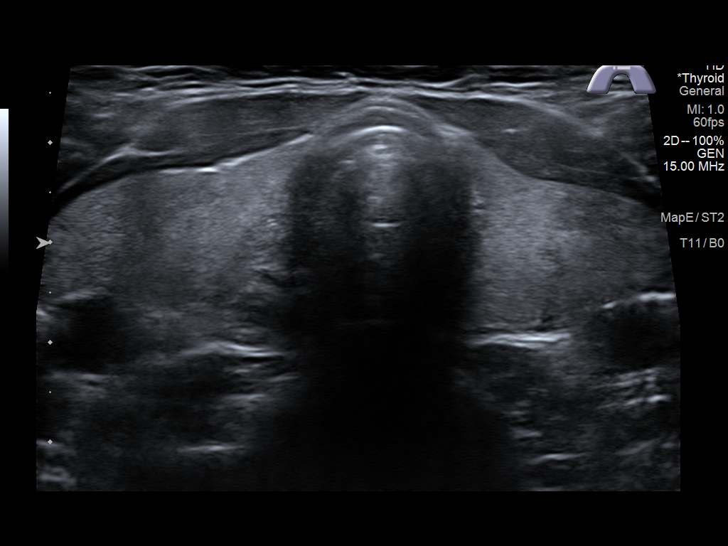
[im 3/35]
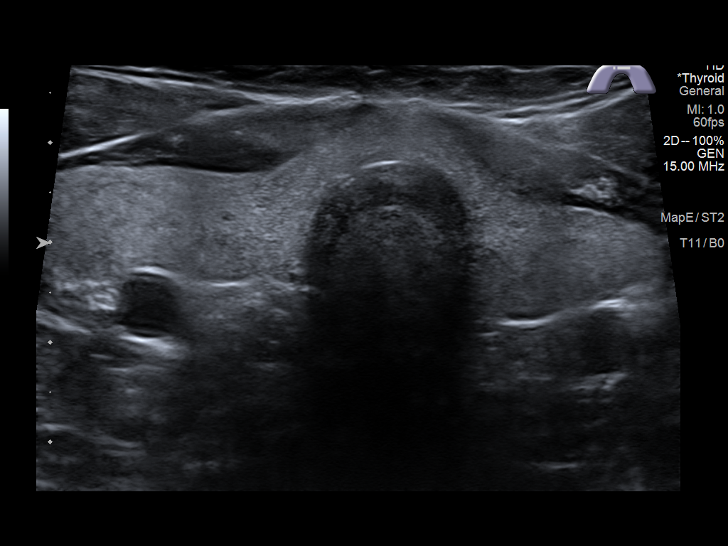
[im 6/35]
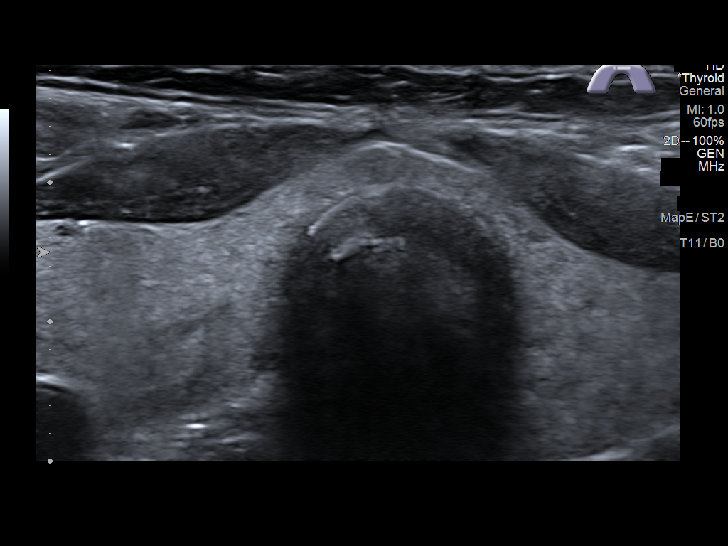
[im 9/35]
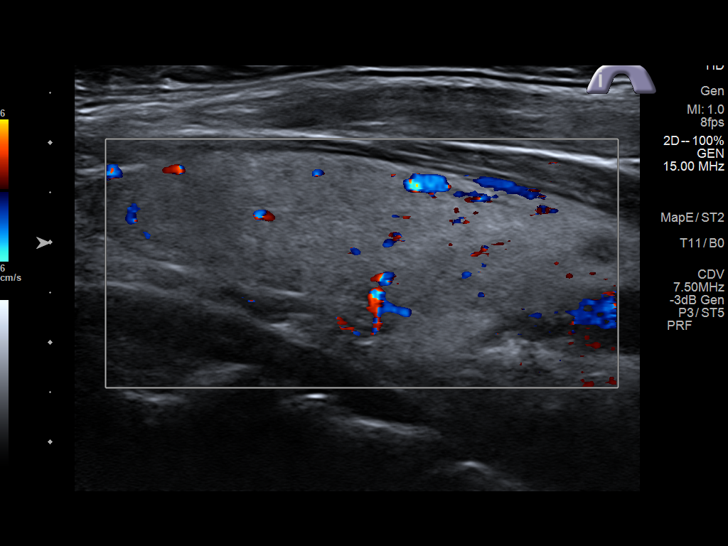
[im 12/35]
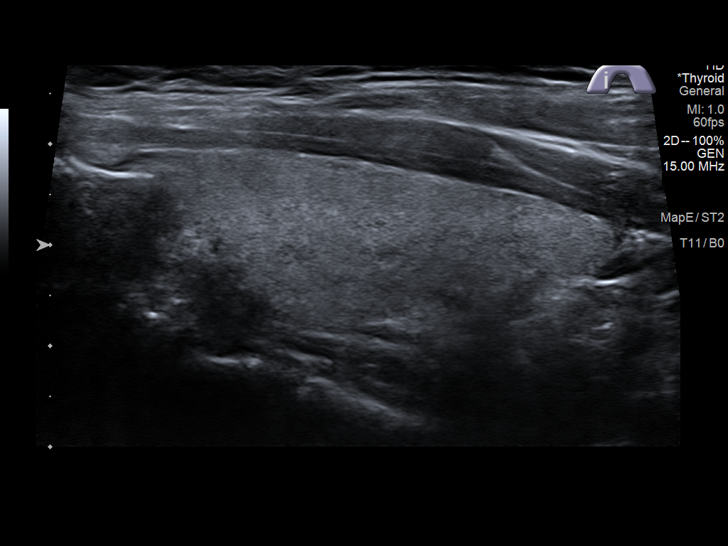
[im 13/35]
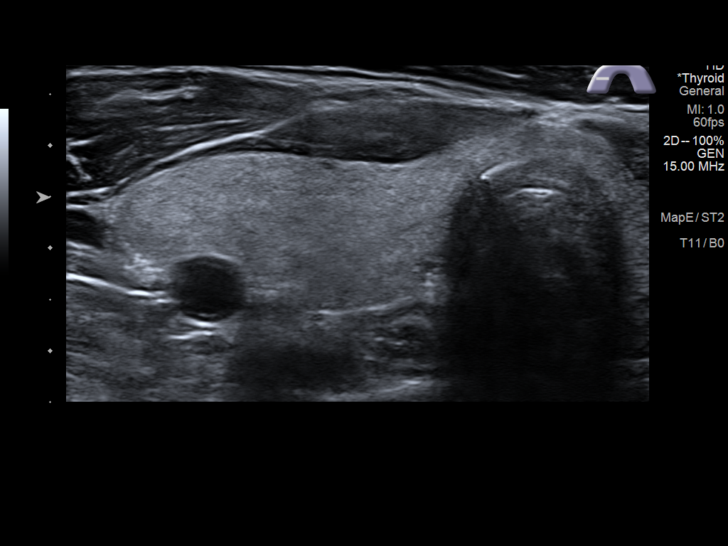
[im 16/35]
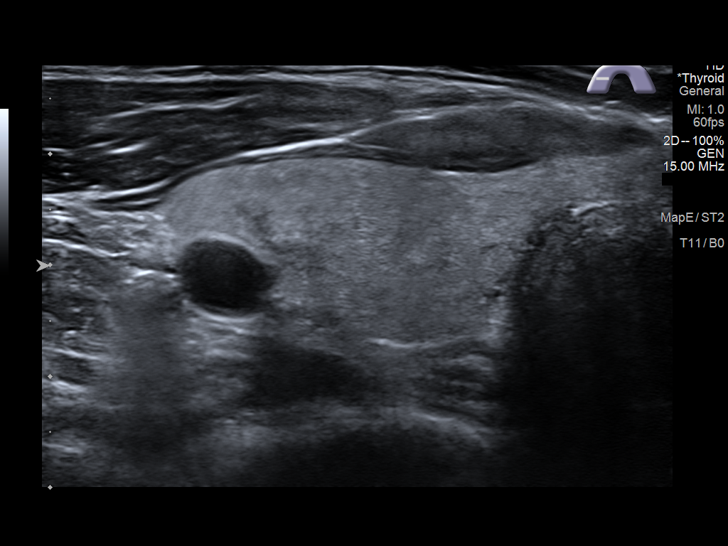
[im 19/35]
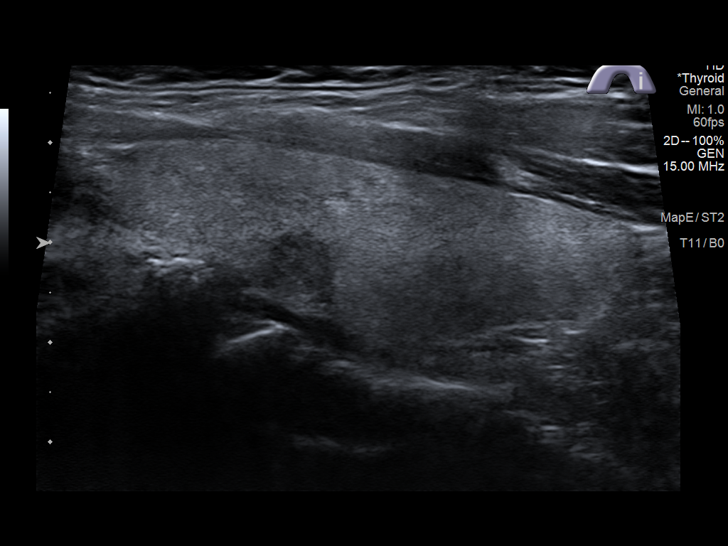
[im 22/35]
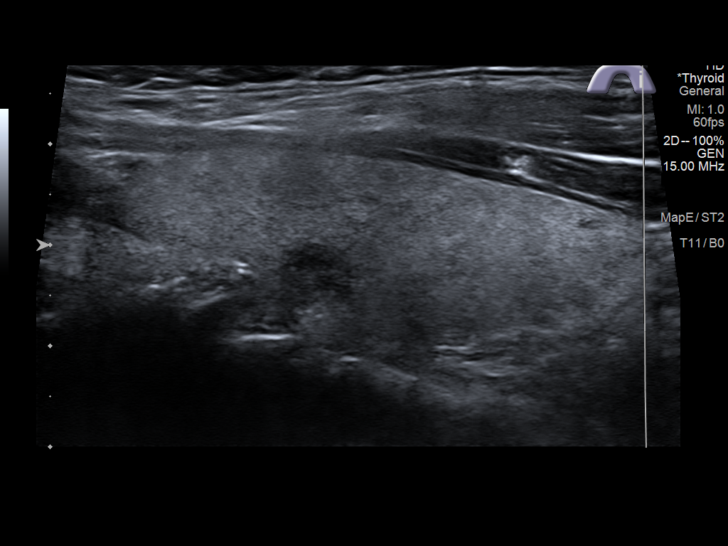
[im 23/35]
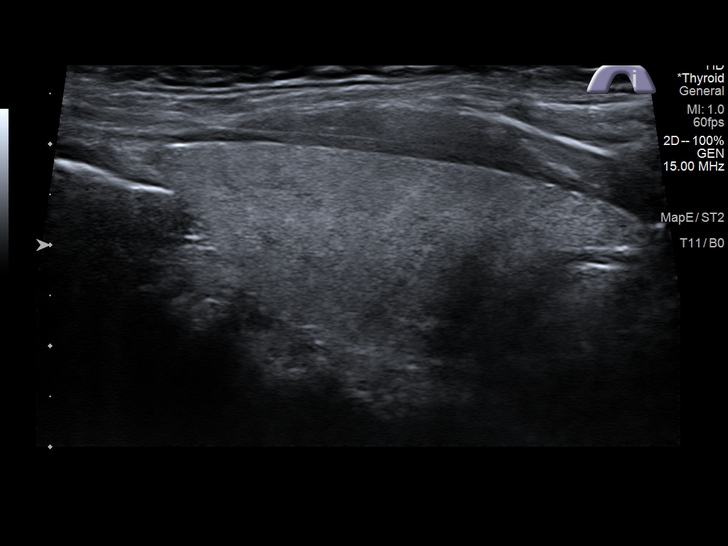
[im 26/35]
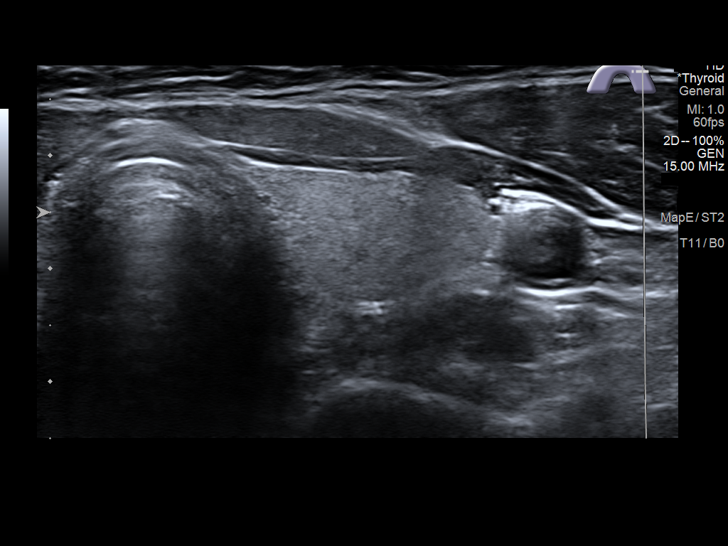
[im 29/35]
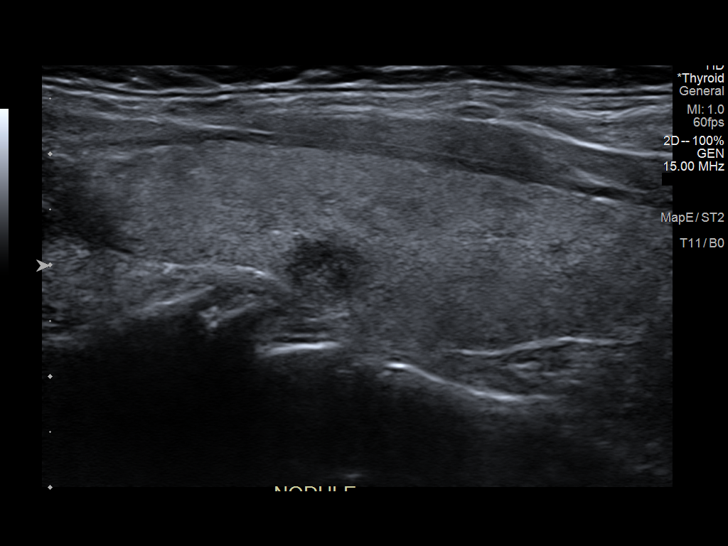
[im 32/35]
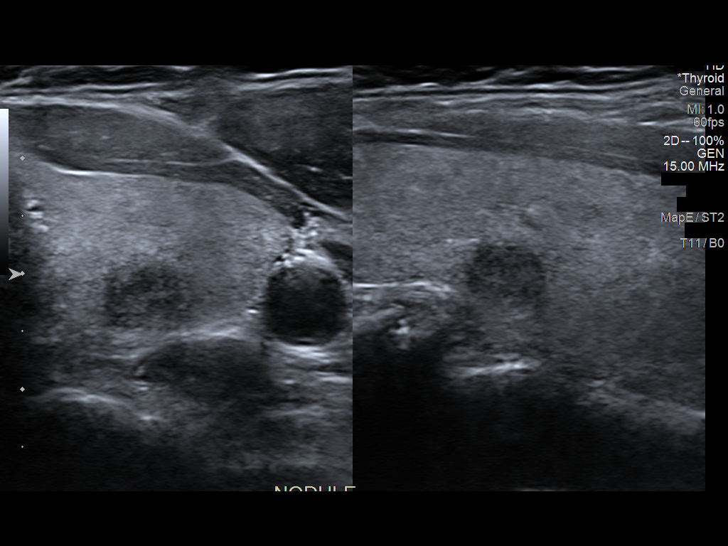
[im 35/35]
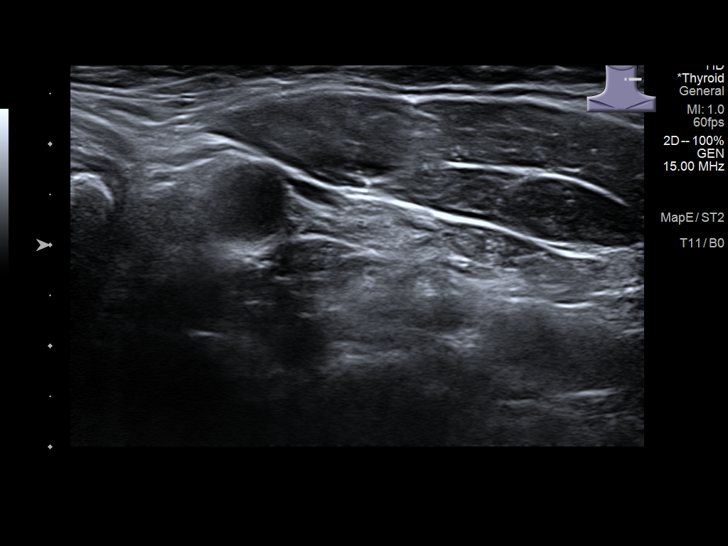

[14 of 25 positions shown; findings below may reference images not displayed]

FINDINGS: Parenchymal Echotexture: Mildly heterogenous

Isthmus: 0.3 cm

Right lobe: 0.8 cm x 1.8 cm x 3.3 cm

Left lobe: 5.5 cm x 1.7 cm x 2.1 cm

_________________________________________________________

Estimated total number of nodules >/= 1 cm: 0

Number of spongiform nodules >/=  2 cm not described below (TR1): 0

Number of mixed cystic and solid nodules >/= 1.5 cm not described
below (TR2): 0

_________________________________________________________

Left thyroid nodule/pseudo nodule measures 8 mm and does not meet
criteria for surveillance.

No adenopathy

No thyroid nodule meets criteria for biopsy or surveillance, as
designated by the newly established ACR TI-RADS criteria.

Recommendations follow those established by the new ACR TI-RADS
criteria ([HOSPITAL] 3361;[DATE]).
IMPRESSION: Heterogeneous thyroid compatible with medical thyroid disease, as
above.

## 2023-12-27 ENCOUNTER — Other Ambulatory Visit: Payer: Self-pay | Admitting: *Deleted

## 2023-12-27 DIAGNOSIS — E118 Type 2 diabetes mellitus with unspecified complications: Secondary | ICD-10-CM
